# Patient Record
Sex: Female | Born: 1965 | Race: Black or African American | Hispanic: No | Smoking: Never smoker
Health system: Southern US, Community
[De-identification: ages and names within clinical notes are randomized; demographics above are authoritative.]

## PROBLEM LIST (undated history)

## (undated) DIAGNOSIS — J45909 Unspecified asthma, uncomplicated: Secondary | ICD-10-CM

## (undated) DIAGNOSIS — M549 Dorsalgia, unspecified: Secondary | ICD-10-CM

## (undated) DIAGNOSIS — Z765 Malingerer [conscious simulation]: Secondary | ICD-10-CM

## (undated) DIAGNOSIS — I10 Essential (primary) hypertension: Secondary | ICD-10-CM

## (undated) DIAGNOSIS — I639 Cerebral infarction, unspecified: Secondary | ICD-10-CM

## (undated) DIAGNOSIS — G8929 Other chronic pain: Secondary | ICD-10-CM

## (undated) DIAGNOSIS — F1190 Opioid use, unspecified, uncomplicated: Secondary | ICD-10-CM

## (undated) HISTORY — PX: KNEE SURGERY: SHX244

## (undated) HISTORY — PX: CHOLECYSTECTOMY: SHX55

## (undated) HISTORY — PX: ABDOMINAL HYSTERECTOMY: SHX81

---

## 2005-09-22 ENCOUNTER — Emergency Department (HOSPITAL_COMMUNITY): Admission: EM | Admit: 2005-09-22 | Discharge: 2005-09-22 | Payer: Self-pay | Admitting: Emergency Medicine

## 2017-10-01 ENCOUNTER — Encounter (HOSPITAL_BASED_OUTPATIENT_CLINIC_OR_DEPARTMENT_OTHER): Payer: Self-pay

## 2017-10-01 ENCOUNTER — Other Ambulatory Visit: Payer: Self-pay

## 2017-10-01 ENCOUNTER — Emergency Department (HOSPITAL_BASED_OUTPATIENT_CLINIC_OR_DEPARTMENT_OTHER)
Admission: EM | Admit: 2017-10-01 | Discharge: 2017-10-02 | Disposition: A | Discharge status: Home / Self Care | Payer: Self-pay | Attending: Emergency Medicine | Admitting: Emergency Medicine

## 2017-10-01 DIAGNOSIS — G894 Chronic pain syndrome: Secondary | ICD-10-CM | POA: Insufficient documentation

## 2017-10-01 DIAGNOSIS — Z91010 Allergy to peanuts: Secondary | ICD-10-CM | POA: Insufficient documentation

## 2017-10-01 DIAGNOSIS — Z9049 Acquired absence of other specified parts of digestive tract: Secondary | ICD-10-CM | POA: Insufficient documentation

## 2017-10-01 DIAGNOSIS — Z765 Malingerer [conscious simulation]: Secondary | ICD-10-CM | POA: Insufficient documentation

## 2017-10-01 DIAGNOSIS — J45909 Unspecified asthma, uncomplicated: Secondary | ICD-10-CM | POA: Insufficient documentation

## 2017-10-01 DIAGNOSIS — I10 Essential (primary) hypertension: Secondary | ICD-10-CM | POA: Insufficient documentation

## 2017-10-01 HISTORY — DX: Malingerer (conscious simulation): Z76.5

## 2017-10-01 HISTORY — DX: Dorsalgia, unspecified: M54.9

## 2017-10-01 HISTORY — DX: Opioid use, unspecified, uncomplicated: F11.90

## 2017-10-01 HISTORY — DX: Other chronic pain: G89.29

## 2017-10-01 HISTORY — DX: Essential (primary) hypertension: I10

## 2017-10-01 HISTORY — DX: Unspecified asthma, uncomplicated: J45.909

## 2017-10-01 MED ORDER — KETOROLAC TROMETHAMINE 30 MG/ML IJ SOLN
INTRAMUSCULAR | Status: AC
  Administered 2017-10-01: 30 mg
  Filled 2017-10-01: qty 1

## 2017-10-01 MED ORDER — DEXAMETHASONE SODIUM PHOSPHATE 4 MG/ML IJ SOLN
INTRAMUSCULAR | Status: AC
  Filled 2017-10-01: qty 1

## 2017-10-01 MED ORDER — ONDANSETRON 4 MG PO TBDP
ORAL_TABLET | ORAL | Status: AC
  Administered 2017-10-01: 4 mg
  Filled 2017-10-01: qty 1

## 2017-10-01 MED ORDER — DEXAMETHASONE SODIUM PHOSPHATE 10 MG/ML IJ SOLN
INTRAMUSCULAR | Status: AC
  Administered 2017-10-01: 10 mg
  Filled 2017-10-01: qty 1

## 2017-10-01 NOTE — ED Triage Notes (Addendum)
Pt c/o chronic back pain-states she just flew from [place] today and left meds at home-NAD-steady gait-pt entered triage talking on phone

## 2017-10-01 NOTE — ED Notes (Signed)
ED Provider at bedside. 

## 2017-10-02 ENCOUNTER — Encounter (HOSPITAL_BASED_OUTPATIENT_CLINIC_OR_DEPARTMENT_OTHER): Payer: Self-pay | Admitting: Emergency Medicine

## 2017-10-02 LAB — URINALYSIS, ROUTINE W REFLEX MICROSCOPIC
BILIRUBIN URINE: NEGATIVE
Glucose, UA: NEGATIVE mg/dL
Ketones, ur: NEGATIVE mg/dL
Leukocytes, UA: NEGATIVE
Nitrite: NEGATIVE
PH: 7 (ref 5.0–8.0)
Protein, ur: 100 mg/dL — AB
SPECIFIC GRAVITY, URINE: 1.02 (ref 1.005–1.030)

## 2017-10-02 LAB — URINALYSIS, MICROSCOPIC (REFLEX): WBC UA: NONE SEEN WBC/hpf (ref 0–5)

## 2017-10-02 MED ORDER — LIDOCAINE 5 % EX PTCH: 1.0000 | MEDICATED_PATCH | CUTANEOUS | 0 refills | Status: DC

## 2017-10-02 MED ORDER — CLONIDINE HCL 0.1 MG PO TABS
0.1000 mg | ORAL_TABLET | Freq: Once | ORAL | Status: AC
  Administered 2017-10-02: 0.1 mg via ORAL
  Filled 2017-10-02: qty 1

## 2017-10-02 NOTE — ED Provider Notes (Signed)
[facility] EMERGENCY DEPARTMENT Provider Note   CSN: [id number] Arrival date & time: 10/01/17  2240     History   Chief Complaint Chief Complaint  Patient presents with  . Back Pain    HPI Gina DykeKathy Cummings is a 52 y.o. female.  The history is provided by the patient.  Back Pain   This is a chronic problem. The current episode started more than 1 week ago (years). The problem occurs constantly. The problem has not changed since onset.The pain is associated with no known injury. The pain is present in the lumbar spine and sacro-iliac joint. The quality of the pain is described as stabbing. The pain does not radiate. The pain is severe. The pain is the same all the time. Pertinent negatives include no chest pain, no fever, no numbness, no weight loss, no headaches, no abdominal pain, no abdominal swelling, no bowel incontinence, no perianal numbness, no bladder incontinence, no dysuria, no pelvic pain, no leg pain, no paresthesias, no paresis, no tingling and no weakness. The treatment provided no relief. Risk factors include obesity.  Patient with known HTN, chronic pain on multiple opioids at home reports she is here until 10/17/17 to help her pregnant daughter who she reports is having seizures.  [place] and reports [place] [facility] [place] her safe and cannot get them until Tuesday.    Past Medical History:  Diagnosis Date  . Asthma   . Chronic back pain   . Chronic, continuous use of opioids   . Hypertension     There are no active problems to display for this patient.   Past Surgical History:  Procedure Laterality Date  . ABDOMINAL HYSTERECTOMY    . CHOLECYSTECTOMY    . KNEE SURGERY       OB History   None      Home Medications    Prior to Admission medications   Medication Sig Start Date End Date Taking? Authorizing Provider  lidocaine (LIDODERM) 5 % Place 1 patch onto the skin daily. Remove &  Discard patch within 12 hours or as directed by MD 10/02/17   Nicanor AlconPalumbo, Kenzlie Disch, MD    Family History No family history on file.  Social History Social History   Tobacco Use  . Smoking status: Never Smoker  . Smokeless tobacco: Never Used  Substance Use Topics  . Alcohol use: Never    Frequency: Never  . Drug use: Never     Allergies   Lisinopril; Morphine and related; Peanut-containing drug products; Penicillins; and Shellfish allergy   Review of Systems Review of Systems  Constitutional: Negative for fever and weight loss.  Cardiovascular: Negative for chest pain.  Gastrointestinal: Negative for abdominal pain and bowel incontinence.  Genitourinary: Negative for bladder incontinence, dysuria and pelvic pain.  Musculoskeletal: Positive for back pain.  Neurological: Negative for tingling, weakness, numbness, headaches and paresthesias.  All other systems reviewed and are negative.    Physical Exam Updated Vital Signs BP (!) 190/96 (BP Location: Right Arm)   Pulse 84   Temp 98.4 F (36.9 C) (Oral)   Resp 20   Ht 4\' 11"  (1.499 m)   Wt 99.3 kg (219 lb)   SpO2 100%   BMI 44.23 kg/m   Physical Exam  Constitutional: She is oriented to person, place, and time. She appears well-developed and well-nourished. No distress.  HENT:  Head: Normocephalic and atraumatic.  Mouth/Throat: No oropharyngeal  exudate.  Eyes: Pupils are equal, round, and reactive to light. Conjunctivae are normal.  Neck: Normal range of motion. Neck supple.  Cardiovascular: Normal rate, regular rhythm, normal heart sounds and intact distal pulses.  Pulmonary/Chest: Effort normal and breath sounds normal. No stridor. She has no wheezes. She has no rales.  Abdominal: Soft. Bowel sounds are normal. She exhibits no mass. There is no tenderness. There is no rebound and no guarding.  Musculoskeletal: Normal range of motion.  Neurological: She is alert and oriented to person, place, and time.  Skin: Skin is  warm and dry. Capillary refill takes less than 2 seconds.  Psychiatric: She has a normal mood and affect.  Nursing note and vitals reviewed.    ED Treatments / Results  Labs (all labs ordered are listed, but only abnormal results are displayed)  Results for orders placed or performed during the hospital encounter of 10/01/17  Urinalysis, Routine w reflex microscopic  Result Value Ref Range   Color, Urine YELLOW YELLOW   APPearance HAZY (A) CLEAR   Specific Gravity, Urine 1.020 1.005 - 1.030   pH 7.0 5.0 - 8.0   Glucose, UA NEGATIVE NEGATIVE mg/dL   Hgb urine dipstick TRACE (A) NEGATIVE   Bilirubin Urine NEGATIVE NEGATIVE   Ketones, ur NEGATIVE NEGATIVE mg/dL   Protein, ur 960 (A) NEGATIVE mg/dL   Nitrite NEGATIVE NEGATIVE   Leukocytes, UA NEGATIVE NEGATIVE  Urinalysis, Microscopic (reflex)  Result Value Ref Range   RBC / HPF 0-5 0 - 5 RBC/hpf   WBC, UA NONE SEEN 0 - 5 WBC/hpf   Bacteria, UA RARE (A) NONE SEEN   Squamous Epithelial / LPF 0-5 (A) NONE SEEN    Procedures Procedures (including critical care time)  Medications Ordered in ED Medications  dexamethasone (DECADRON) 4 MG/ML injection (has no administration in time range)  ondansetron (ZOFRAN-ODT) 4 MG disintegrating tablet (4 mg  Given 10/01/17 2347)  ketorolac (TORADOL) 30 MG/ML injection (30 mg  Given 10/01/17 2348)  dexamethasone (DECADRON) 10 MG/ML injection (10 mg  Given 10/01/17 2351)  cloNIDine (CATAPRES) tablet 0.1 mg (0.1 mg Oral Given 10/02/17 0030)    EDP was polite but honest that we do not refill narcotics in the ED and apologized for the inconvenience.  I stated we would treat her with non narcotic medications and she was amenable to this initially.    According to records in care everywhere she is on oxycodone 15 mg and norco and an additional narcotic which appears to be morphine from records (she states she is allergic to morphine here).  Per notes she received 104# oxy IR on 3/21. The last note from  her doctor stated she was obtaining narcotics from multiple sources.   She was treated for pain with non narcotic meds in the ED and for potential withdrawal clonidine.  She stated she is here until 4/13 and can't get her meds til Tuesday.   I was approached by nurse stating the patient is now getting her meds from her daughter and they will be here tomorrow.  The story is changing and I suspect drug seeking behavior and I will not be giving narcotics as the patient will continue to return.    Patient refusing to leave the ED at discharge.    Final Clinical Impressions(s) / ED Diagnoses   Final diagnoses:  Chronic pain disorder   Return for weakness, numbness, changes in vision or speech, fevers >100.4 unrelieved by medication, shortness of breath, intractable vomiting, or diarrhea,  abdominal pain, Inability to tolerate liquids or food, cough, altered mental status or any concerns. No signs of systemic illness or infection. The patient is nontoxic-appearing on exam and vital signs are within normal limits.   I have reviewed the triage vital signs and the nursing notes. Pertinent labs &imaging results that were available during my care of the patient were reviewed by me and considered in my medical decision making (see chart for details).  After history, exam, and medical workup I feel the patient has been appropriately medically screened and is safe for discharge home. Pertinent diagnoses were discussed with the patient. Patient was given return precautions.   Patient tore up RX and left  ED Discharge Orders        Ordered    lidocaine (LIDODERM) 5 %  Every 24 hours     10/02/17 0034       Kipper Buch, MD 10/02/17 1610

## 2017-10-02 NOTE — ED Notes (Signed)
Patient is upset stating that she don't want the Lidocaine patch.  She stated that she forgot her pain med in [place] where she came from.  She wanted to talk to MD.  MD made aware as well as the the charge nurse.

## 2017-10-02 NOTE — ED Notes (Signed)
Asking nurse to ask MD for pain medicine and that her pain medicine is going to be over nighted  and will arrive tomorrow. ED MD informed of same

## 2017-10-02 NOTE — ED Notes (Signed)
Pt upset about not receiving a narcotic prescription. Pt ripped off rx for lidocaine and gave it back stating it doesn't work. Pt became very argumentative upon discharge. Pt wanting us to pay for her [employer] to hotel. [facility] [employer] for her to call. Security escorted pt to discharge.

## 2019-02-08 ENCOUNTER — Encounter (HOSPITAL_COMMUNITY): Payer: Self-pay | Admitting: Family Medicine

## 2019-02-08 ENCOUNTER — Emergency Department (HOSPITAL_COMMUNITY): Payer: PRIVATE HEALTH INSURANCE

## 2019-02-08 ENCOUNTER — Emergency Department (HOSPITAL_COMMUNITY)
Admission: EM | Admit: 2019-02-08 | Discharge: 2019-02-09 | Disposition: A | Discharge status: Home / Self Care | Payer: PRIVATE HEALTH INSURANCE | Attending: Emergency Medicine | Admitting: Emergency Medicine

## 2019-02-08 ENCOUNTER — Other Ambulatory Visit: Payer: Self-pay

## 2019-02-08 DIAGNOSIS — R0789 Other chest pain: Secondary | ICD-10-CM | POA: Diagnosis not present

## 2019-02-08 DIAGNOSIS — J45909 Unspecified asthma, uncomplicated: Secondary | ICD-10-CM | POA: Insufficient documentation

## 2019-02-08 DIAGNOSIS — R197 Diarrhea, unspecified: Secondary | ICD-10-CM

## 2019-02-08 DIAGNOSIS — I10 Essential (primary) hypertension: Secondary | ICD-10-CM

## 2019-02-08 DIAGNOSIS — R51 Headache: Secondary | ICD-10-CM | POA: Diagnosis not present

## 2019-02-08 DIAGNOSIS — R112 Nausea with vomiting, unspecified: Secondary | ICD-10-CM

## 2019-02-08 HISTORY — DX: Cerebral infarction, unspecified: I63.9

## 2019-02-08 LAB — BASIC METABOLIC PANEL
Anion gap: 13 (ref 5–15)
BUN: 6 mg/dL (ref 6–20)
CO2: 23 mmol/L (ref 22–32)
Calcium: 9.2 mg/dL (ref 8.9–10.3)
Chloride: 103 mmol/L (ref 98–111)
Creatinine, Ser: 0.71 mg/dL (ref 0.44–1.00)
GFR calc Af Amer: >60 mL/min (ref >60)
GFR calc non Af Amer: >60 mL/min (ref >60)
Glucose, Bld: 89 mg/dL (ref 70–99)
Potassium: 3.4 mmol/L — ABNORMAL LOW (ref 3.5–5.1)
Sodium: 139 mmol/L (ref 135–145)

## 2019-02-08 LAB — TROPONIN I (HIGH SENSITIVITY)
Troponin I (High Sensitivity): 4 ng/L (ref <18)
Troponin I (High Sensitivity): 5 ng/L (ref <18)

## 2019-02-08 LAB — CBC
HCT: 41.5 % (ref 36.0–46.0)
Hemoglobin: 13.8 g/dL (ref 12.0–15.0)
MCH: 31.3 pg (ref 26.0–34.0)
MCHC: 33.3 g/dL (ref 30.0–36.0)
MCV: 94.1 fL (ref 80.0–100.0)
Platelets: 280 10*3/uL (ref 150–400)
RBC: 4.41 MIL/uL (ref 3.87–5.11)
RDW: 12.9 % (ref 11.5–15.5)
WBC: 3.6 10*3/uL — ABNORMAL LOW (ref 4.0–10.5)
nRBC: 0 % (ref 0.0–0.2)

## 2019-02-08 LAB — I-STAT BETA HCG BLOOD, ED (MC, WL, AP ONLY): I-stat hCG, quantitative: <5 m[IU]/mL (ref <5)

## 2019-02-08 LAB — D-DIMER, QUANTITATIVE (NOT AT ARMC): D-Dimer, Quant: 0.28 ug/mL-FEU (ref 0.00–0.50)

## 2019-02-08 MED ORDER — SODIUM CHLORIDE 0.9% FLUSH: 3.0000 mL | Freq: Once | INTRAVENOUS | Status: DC

## 2019-02-08 MED ORDER — ONDANSETRON HCL 4 MG/2ML IJ SOLN
4.0000 mg | Freq: Once | INTRAMUSCULAR | Status: AC
  Administered 2019-02-08: 4 mg via INTRAVENOUS
  Filled 2019-02-08: qty 2

## 2019-02-08 MED ORDER — CLONIDINE HCL 0.1 MG PO TABS
0.1000 mg | ORAL_TABLET | Freq: Once | ORAL | Status: AC
  Administered 2019-02-09: 0.1 mg via ORAL
  Filled 2019-02-08: qty 1

## 2019-02-08 MED ORDER — FENTANYL CITRATE (PF) 100 MCG/2ML IJ SOLN
50.0000 ug | Freq: Once | INTRAMUSCULAR | Status: DC
  Filled 2019-02-08: qty 2

## 2019-02-08 MED ORDER — HYDROMORPHONE HCL 1 MG/ML IJ SOLN
0.5000 mg | Freq: Once | INTRAMUSCULAR | Status: AC
  Administered 2019-02-08: 23:00:00 0.5 mg via INTRAVENOUS
  Filled 2019-02-08: qty 1

## 2019-02-08 NOTE — ED Notes (Signed)
Pt called out with IV team at bedside for assistance. Writer went to check on pt and found pt upset that IV team had placed her IV and pt did not have her pain medication immediatly following. Primary RN aware.

## 2019-02-08 NOTE — ED Triage Notes (Signed)
Patient states she has took her BP medication in 2 days due to her luggage not being here from her flight.

## 2019-02-08 NOTE — ED Notes (Signed)
IV team at bedside 

## 2019-02-08 NOTE — ED Notes (Signed)
Pt given new emesis bag after having x2 emesis episodes in blankets on bed. Pt cleaned and Primary RN at bedside administering medication.

## 2019-02-08 NOTE — ED Triage Notes (Signed)
Patient is complaining of left sided chest pain that started Sunday. Patient states the pain is sharp and radiates to her back. Patient reports she has flew from [facility] today. Associated symptoms of shortness of breath, lightheadedness, dizziness, diaphoresis, nausea, and vomiting. Patient ambulates with a steady gait, skin is warm and dry, and respirations are even and regular.

## 2019-02-08 NOTE — ED Notes (Signed)
ED PA at bedside

## 2019-02-08 NOTE — ED Provider Notes (Signed)
[facility]-EMERGENCY DEPT Provider Note   CSN: [id number] Arrival date & time: 02/08/19  1704    History   Chief Complaint Chief Complaint  Patient presents with  . Chest Pain    HPI Gina Cummings is a 53 y.o. female.     The history is provided by the patient and medical records. No language interpreter was used.  Chest Pain Associated symptoms: abdominal pain, nausea and vomiting   Associated symptoms: no cough, no palpitations and no shortness of breath     Gina Cummings is a 53 y.o. female  with a PMH of hypertension, prior stroke, drug-seeking behavior and chronic pain who presents to the Emergency Department complaining of chest pain which started yesterday.  Also reports nausea with 4-5 episodes of emesis yesterday.  Has had 2 episodes of diarrhea as well.  She reports that she flew here from [place] 3 days ago.  Unfortunately, her medications are in her luggage which have not made it to [name] yet.  She states that she is on HCTZ and clonidine regularly, but does not know what dose or how often.  Chart review does show multiple ER visits with elevated blood pressure in similar ranges today.  She denies taking any opioids regularly, but I was notified by pharmacy tech that according to the national database she was prescribed #70 oxycodone HCL 15 mg on July 31.  She reports history of similar constellation of symptoms several times in the past when her blood pressure gets high.  She states that she normally gets pain medication and then her blood pressure comes down and she feels better.  Past Medical History:  Diagnosis Date  . Asthma   . Chronic back pain   . Chronic, continuous use of opioids   . Drug-seeking behavior   . Hypertension   . Stroke [facility](H[facility])     There are no active problems to display for this patient.   Past Surgical History:  Procedure Laterality Date  . ABDOMINAL HYSTERECTOMY    . CHOLECYSTECTOMY    . KNEE SURGERY       OB  History   No obstetric history on file.      Home Medications    Prior to Admission medications   Medication Sig Start Date End Date Taking? Authorizing Provider  lidocaine (LIDODERM) 5 % Place 1 patch onto the skin daily. Remove & Discard patch within 12 hours or as directed by MD Patient not taking: Reported on 02/08/2019 10/02/17   Nicanor AlconPalumbo, April, MD    Family History History reviewed. No pertinent family history.  Social History Social History   Tobacco Use  . Smoking status: Never Smoker  . Smokeless tobacco: Never Used  Substance Use Topics  . Alcohol use: Never    Frequency: Never  . Drug use: Never     Allergies   Peanut-containing drug products, Penicillins, Aspirin, Gabapentin, Ibuprofen, Lisinopril, Morphine and related, Nsaids, Shellfish allergy, Tramadol, Trazodone, Fentanyl, and Morphine   Review of Systems Review of Systems  Respiratory: Negative for cough and shortness of breath.   Cardiovascular: Positive for chest pain. Negative for palpitations and leg swelling.  Gastrointestinal: Positive for abdominal pain, diarrhea, nausea and vomiting.  All other systems reviewed and are negative.    Physical Exam Updated Vital Signs BP (!) 209/107   Pulse (!) 103   Temp 98.2 F (36.8 C) (Oral)   Resp (!) 21   Ht 4\' 11"  (1.499 m)   Wt 93.9 kg  SpO2 100%   BMI 41.81 kg/m   Physical Exam Vitals signs and nursing note reviewed.  Constitutional:      General: She is not in acute distress.    Appearance: She is well-developed.  HENT:     Head: Normocephalic and atraumatic.  Neck:     Musculoskeletal: Neck supple.  Cardiovascular:     Rate and Rhythm: Normal rate and regular rhythm.     Heart sounds: Normal heart sounds. No murmur.  Pulmonary:     Effort: Pulmonary effort is normal. No respiratory distress.     Breath sounds: Normal breath sounds.     Comments: + Chest wall tenderness Abdominal:     General: There is no distension.      Palpations: Abdomen is soft.     Comments: Generalized abdominal tenderness without focality.  No rebound or guarding.  Skin:    General: Skin is warm and dry.  Neurological:     Mental Status: She is alert and oriented to person, place, and time.      ED Treatments / Results  Labs (all labs ordered are listed, but only abnormal results are displayed) Labs Reviewed  BASIC METABOLIC PANEL - Abnormal; Notable for the following components:      Result Value   Potassium 3.4 (*)    All other components within normal limits  CBC - Abnormal; Notable for the following components:   WBC 3.6 (*)    All other components within normal limits  D-DIMER, QUANTITATIVE (NOT AT [facility])  I-STAT BETA HCG BLOOD, ED ([facility], [facility], [facility] ONLY)  TROPONIN I (HIGH SENSITIVITY)  TROPONIN I (HIGH SENSITIVITY)    EKG EKG Interpretation  Date/Time:  Tuesday February 08 2019 17:30:46 EDT Ventricular Rate:  77 PR Interval:    QRS Duration: 108 QT Interval:  414 QTC Calculation: 469 R Axis:   40 Text Interpretation:  Sinus rhythm Ventricular premature complex Probable left atrial enlargement Left ventricular hypertrophy Baseline wander in lead(s) V6 No old tracing to compare Confirmed by Delon Revelo, Cyril Mourning [phone]) on 02/09/2019 12:31:56 AM   Radiology Dg Chest 2 View  Result Date: 02/08/2019 CLINICAL DATA:  Nausea, vomiting, dizziness and weakness over the last 2 days. EXAM: CHEST - 2 VIEW COMPARISON:  09/22/2005 FINDINGS: The heart size and mediastinal contours are within normal limits. Both lungs are clear. The visualized skeletal structures are unremarkable. IMPRESSION: No active cardiopulmonary disease. Electronically Signed   By: Nelson Chimes M.D.   On: 02/08/2019 19:24   Ct Head Wo Contrast  Result Date: 02/08/2019 CLINICAL DATA:  Headaches for several months EXAM: CT HEAD WITHOUT CONTRAST TECHNIQUE: Contiguous axial images were obtained from the base of the skull through the vertex without intravenous contrast.  COMPARISON:  None. FINDINGS: Brain: No evidence of acute infarction, hemorrhage, hydrocephalus, extra-axial collection or mass lesion/mass effect. Vascular: No hyperdense vessel or unexpected calcification. Skull: Normal. Negative for fracture or focal lesion. Sinuses/Orbits: No acute finding. Other: None. IMPRESSION: Normal head CT Electronically Signed   By: Inez Catalina M.D.   On: 02/08/2019 23:05    Procedures Procedures (including critical care time)  Medications Ordered in ED Medications  sodium chloride flush (NS) 0.9 % injection 3 mL (0 mLs Intravenous Hold 02/08/19 2132)  hydrochlorothiazide (HYDRODIURIL) tablet 25 mg (has no administration in time range)  ondansetron (ZOFRAN) injection 4 mg (4 mg Intravenous Given 02/08/19 2321)  HYDROmorphone (DILAUDID) injection 0.5 mg (0.5 mg Intravenous Given 02/08/19 2321)  cloNIDine (CATAPRES) tablet 0.1 mg (0.1 mg  Oral Given 02/09/19 0100)  potassium chloride SA (K-DUR) CR tablet 40 mEq (40 mEq Oral Given 02/09/19 0059)  promethazine (PHENERGAN) injection 25 mg (25 mg Intravenous Given 02/09/19 0055)  sodium chloride 0.9 % bolus 1,000 mL (1,000 mLs Intravenous New Bag/Given 02/09/19 0053)     Initial Impression / Assessment and Plan / ED Course  I have reviewed the triage vital signs and the nursing notes.  Pertinent labs & imaging results that were available during my care of the patient were reviewed by me and considered in my medical decision making (see chart for details).       Gina Cummings is a 53 y.o. female who presents to ED for 2 days of chest pain as well as nausea, vomiting and diarrhea.  On exam, patient is quite hypertensive.  She is here from [place].  I can see records in care everywhere [facility] which show blood pressures in similar ranges.  For example, on 5/7 she was discharged with blood pressure of 217/117.  On June 12, 194/105.  On 7/21, 206/100.  It appears she is noncompliant with medications and has history of such.  Her  work-up today is very reassuring including negative dimer and troponin.  Chest x-ray and CT head without acute findings.  Overall, I am worried that her constellation of symptoms / history are more consistent with opioid withdrawal.  We will give her home blood pressure medications, fluid bolus and Phenergan and reassess.  Anticipate discharge to home if she is able to tolerate p.o. Care assumed by oncoming provider PA Petrucelli. Case discussed, plan agreed upon.   Final Clinical Impressions(s) / ED Diagnoses   Final diagnoses:  None    ED Discharge Orders    None       Johngabriel Verde, Chase PicketJaime Pilcher, PA-C 02/09/19 0117    Charlynne PanderYao, David Hsienta, MD 02/11/19 1452

## 2019-02-09 DIAGNOSIS — R0789 Other chest pain: Secondary | ICD-10-CM | POA: Diagnosis not present

## 2019-02-09 MED ORDER — OXYCODONE-ACETAMINOPHEN 5-325 MG PO TABS
1.0000 | ORAL_TABLET | Freq: Once | ORAL | Status: AC
  Administered 2019-02-09: 1 via ORAL
  Filled 2019-02-09: qty 1

## 2019-02-09 MED ORDER — PROMETHAZINE HCL 25 MG/ML IJ SOLN
25.0000 mg | Freq: Once | INTRAMUSCULAR | Status: AC
  Administered 2019-02-09: 01:00:00 25 mg via INTRAVENOUS
  Filled 2019-02-09: qty 1

## 2019-02-09 MED ORDER — POTASSIUM CHLORIDE CRYS ER 20 MEQ PO TBCR
40.0000 meq | EXTENDED_RELEASE_TABLET | Freq: Once | ORAL | Status: AC
  Administered 2019-02-09: 01:00:00 40 meq via ORAL
  Filled 2019-02-09: qty 2

## 2019-02-09 MED ORDER — OXYCODONE-ACETAMINOPHEN 5-325 MG PO TABS
ORAL_TABLET | ORAL | Status: AC
  Filled 2019-02-09: qty 1

## 2019-02-09 MED ORDER — PROMETHAZINE HCL 12.5 MG PO TABS: 12.5000 mg | ORAL_TABLET | Freq: Four times a day (QID) | ORAL | 0 refills | Status: DC | PRN

## 2019-02-09 MED ORDER — HYDROCHLOROTHIAZIDE 25 MG PO TABS
25.0000 mg | ORAL_TABLET | Freq: Once | ORAL | Status: AC
  Administered 2019-02-09: 25 mg via ORAL
  Filled 2019-02-09: qty 1

## 2019-02-09 MED ORDER — SODIUM CHLORIDE 0.9 % IV BOLUS
1000.0000 mL | Freq: Once | INTRAVENOUS | Status: AC
  Administered 2019-02-09: 1000 mL via INTRAVENOUS

## 2019-02-09 NOTE — ED Provider Notes (Signed)
01:00: Assumed care of patient from [facility] PA-C at change of shift pending medications & PO challenge.   Please see prior provider note for full H&P.  Labs, imaging, & EKG reviewed and reassuring.  Patient able to tolerate PO w/o difficulty, symptomatically improved, BP improved (hx of similar elevated blood pressure readings w/ prior ED visits)- will discharge home with PRN phenergan per discussion w/ prior provider. I discussed results, treatment plan, need for follow-up, and return precautions with the patient. Provided opportunity for questions, patient confirmed understanding and is in agreement with plan.     Amaryllis Dyke, PA-C 02/09/19 [place], Delice Bison, DO 02/09/19 [phone]

## 2019-02-09 NOTE — Discharge Instructions (Addendum)
You are seen in the emergency department today for nausea, vomiting, diarrhea, and pain.  Your blood pressure was noted to be elevated, please be sure to take your blood pressure medications as prescribed.  You were given doses of these in the emergency department.  We are sending him with Phenergan to take every 6 hours as needed for nausea and vomiting. We have prescribed you new medication(s) today. Discuss the medications prescribed today with your pharmacist as they can have adverse effects and interactions with your other medicines including over the counter and prescribed medications. Seek medical evaluation if you start to experience new or abnormal symptoms after taking one of these medicines, seek care immediately if you start to experience difficulty breathing, feeling of your throat closing, facial swelling, or rash as these could be indications of a more serious allergic reaction  Please follow attached diet guidelines to help with diarrhea.  Please take your regular medicines as prescribed once you have these.  Please follow-up with primary care, if you do not have a primary care please call with a number circled in your discharge instructions.  Return to the ER for new or worsening symptoms or any other concerns.

## 2019-02-09 NOTE — ED Notes (Signed)
Dropped first Percocet on the floor. Dale Harwood witnessed the wast in the sharps container

## 2019-02-10 ENCOUNTER — Emergency Department (HOSPITAL_COMMUNITY)
Admission: EM | Admit: 2019-02-10 | Discharge: 2019-02-10 | Disposition: A | Discharge status: Home / Self Care | Payer: PRIVATE HEALTH INSURANCE | Attending: Emergency Medicine | Admitting: Emergency Medicine

## 2019-02-10 ENCOUNTER — Other Ambulatory Visit: Payer: Self-pay

## 2019-02-10 ENCOUNTER — Encounter (HOSPITAL_COMMUNITY): Payer: Self-pay | Admitting: Emergency Medicine

## 2019-02-10 DIAGNOSIS — Z8709 Personal history of other diseases of the respiratory system: Secondary | ICD-10-CM | POA: Diagnosis not present

## 2019-02-10 DIAGNOSIS — F1123 Opioid dependence with withdrawal: Secondary | ICD-10-CM | POA: Diagnosis not present

## 2019-02-10 DIAGNOSIS — M5432 Sciatica, left side: Secondary | ICD-10-CM

## 2019-02-10 DIAGNOSIS — I10 Essential (primary) hypertension: Secondary | ICD-10-CM | POA: Diagnosis not present

## 2019-02-10 DIAGNOSIS — R51 Headache: Secondary | ICD-10-CM | POA: Diagnosis not present

## 2019-02-10 DIAGNOSIS — Z91010 Allergy to peanuts: Secondary | ICD-10-CM | POA: Diagnosis not present

## 2019-02-10 DIAGNOSIS — R42 Dizziness and giddiness: Secondary | ICD-10-CM | POA: Diagnosis not present

## 2019-02-10 DIAGNOSIS — M5442 Lumbago with sciatica, left side: Secondary | ICD-10-CM | POA: Insufficient documentation

## 2019-02-10 DIAGNOSIS — R112 Nausea with vomiting, unspecified: Secondary | ICD-10-CM

## 2019-02-10 DIAGNOSIS — F1193 Opioid use, unspecified with withdrawal: Secondary | ICD-10-CM

## 2019-02-10 DIAGNOSIS — R197 Diarrhea, unspecified: Secondary | ICD-10-CM | POA: Insufficient documentation

## 2019-02-10 DIAGNOSIS — M545 Low back pain: Secondary | ICD-10-CM | POA: Diagnosis present

## 2019-02-10 LAB — URINALYSIS, ROUTINE W REFLEX MICROSCOPIC
Bacteria, UA: NONE SEEN
Bilirubin Urine: NEGATIVE
Glucose, UA: NEGATIVE mg/dL
Hgb urine dipstick: NEGATIVE
Ketones, ur: NEGATIVE mg/dL
Leukocytes,Ua: NEGATIVE
Nitrite: NEGATIVE
Protein, ur: 30 mg/dL — AB
Specific Gravity, Urine: 1.023 (ref 1.005–1.030)
pH: 5 (ref 5.0–8.0)

## 2019-02-10 LAB — CBC
HCT: 43.4 % (ref 36.0–46.0)
Hemoglobin: 14.4 g/dL (ref 12.0–15.0)
MCH: 31.1 pg (ref 26.0–34.0)
MCHC: 33.2 g/dL (ref 30.0–36.0)
MCV: 93.7 fL (ref 80.0–100.0)
Platelets: 314 10*3/uL (ref 150–400)
RBC: 4.63 MIL/uL (ref 3.87–5.11)
RDW: 12.6 % (ref 11.5–15.5)
WBC: 3.7 10*3/uL — ABNORMAL LOW (ref 4.0–10.5)
nRBC: 0 % (ref 0.0–0.2)

## 2019-02-10 LAB — BASIC METABOLIC PANEL
Anion gap: 13 (ref 5–15)
BUN: 8 mg/dL (ref 6–20)
CO2: 24 mmol/L (ref 22–32)
Calcium: 9.2 mg/dL (ref 8.9–10.3)
Chloride: 100 mmol/L (ref 98–111)
Creatinine, Ser: 0.96 mg/dL (ref 0.44–1.00)
GFR calc Af Amer: >60 mL/min (ref >60)
GFR calc non Af Amer: >60 mL/min (ref >60)
Glucose, Bld: 83 mg/dL (ref 70–99)
Potassium: 4 mmol/L (ref 3.5–5.1)
Sodium: 137 mmol/L (ref 135–145)

## 2019-02-10 LAB — CBG MONITORING, ED: Glucose-Capillary: 78 mg/dL (ref 70–99)

## 2019-02-10 LAB — HEPATIC FUNCTION PANEL
ALT: 44 U/L (ref 0–44)
AST: 24 U/L (ref 15–41)
Albumin: 4.6 g/dL (ref 3.5–5.0)
Alkaline Phosphatase: 136 U/L — ABNORMAL HIGH (ref 38–126)
Bilirubin, Direct: 0.2 mg/dL (ref 0.0–0.2)
Indirect Bilirubin: 0.6 mg/dL (ref 0.3–0.9)
Total Bilirubin: 0.8 mg/dL (ref 0.3–1.2)
Total Protein: 8.9 g/dL — ABNORMAL HIGH (ref 6.5–8.1)

## 2019-02-10 LAB — I-STAT BETA HCG BLOOD, ED (MC, WL, AP ONLY): I-stat hCG, quantitative: <5 m[IU]/mL

## 2019-02-10 LAB — LIPASE, BLOOD: Lipase: 26 U/L (ref 11–51)

## 2019-02-10 MED ORDER — SODIUM CHLORIDE 0.9 % IV BOLUS
1000.0000 mL | Freq: Once | INTRAVENOUS | Status: AC
  Administered 2019-02-10: 1000 mL via INTRAVENOUS

## 2019-02-10 MED ORDER — ONDANSETRON 4 MG PO TBDP: 4.0000 mg | ORAL_TABLET | Freq: Three times a day (TID) | ORAL | 0 refills | Status: DC | PRN

## 2019-02-10 MED ORDER — OXYCODONE HCL 15 MG PO TABS: 15.0000 mg | ORAL_TABLET | ORAL | 0 refills | Status: DC | PRN

## 2019-02-10 MED ORDER — HYDROMORPHONE HCL 1 MG/ML IJ SOLN
0.5000 mg | Freq: Once | INTRAMUSCULAR | Status: AC
  Administered 2019-02-10: 0.5 mg via INTRAVENOUS
  Filled 2019-02-10: qty 1

## 2019-02-10 MED ORDER — AMLODIPINE BESYLATE 5 MG PO TABS
10.0000 mg | ORAL_TABLET | Freq: Once | ORAL | Status: DC
  Filled 2019-02-10: qty 2

## 2019-02-10 MED ORDER — SODIUM CHLORIDE 0.9% FLUSH: 3.0000 mL | Freq: Once | INTRAVENOUS | Status: DC

## 2019-02-10 MED ORDER — OXYCODONE HCL 5 MG PO TABS
15.0000 mg | ORAL_TABLET | Freq: Once | ORAL | Status: AC
  Administered 2019-02-10: 15 mg via ORAL
  Filled 2019-02-10: qty 3

## 2019-02-10 MED ORDER — ALBUTEROL SULFATE HFA 108 (90 BASE) MCG/ACT IN AERS
2.0000 | INHALATION_SPRAY | Freq: Once | RESPIRATORY_TRACT | Status: AC
  Administered 2019-02-10: 20:00:00 2 via RESPIRATORY_TRACT
  Filled 2019-02-10: qty 6.7

## 2019-02-10 MED ORDER — DIPHENHYDRAMINE HCL 50 MG/ML IJ SOLN
25.0000 mg | Freq: Once | INTRAMUSCULAR | Status: AC
  Administered 2019-02-10: 25 mg via INTRAVENOUS
  Filled 2019-02-10: qty 1

## 2019-02-10 MED ORDER — PROCHLORPERAZINE EDISYLATE 10 MG/2ML IJ SOLN
10.0000 mg | Freq: Once | INTRAMUSCULAR | Status: AC
  Administered 2019-02-10: 10 mg via INTRAVENOUS
  Filled 2019-02-10: qty 2

## 2019-02-10 MED ORDER — NIFEDIPINE ER OSMOTIC RELEASE 30 MG PO TB24
30.0000 mg | ORAL_TABLET | Freq: Once | ORAL | Status: AC
  Administered 2019-02-10: 30 mg via ORAL
  Filled 2019-02-10 (×2): qty 1

## 2019-02-10 MED ORDER — HYDROCHLOROTHIAZIDE 50 MG PO TABS
50.0000 mg | ORAL_TABLET | Freq: Every day | ORAL | Status: DC
  Administered 2019-02-10: 50 mg via ORAL
  Filled 2019-02-10: qty 1

## 2019-02-10 MED ORDER — SERTRALINE HCL 50 MG PO TABS
50.0000 mg | ORAL_TABLET | Freq: Every day | ORAL | Status: DC
  Administered 2019-02-10: 50 mg via ORAL
  Filled 2019-02-10: qty 1

## 2019-02-10 MED ORDER — ONDANSETRON HCL 4 MG/2ML IJ SOLN
4.0000 mg | Freq: Once | INTRAMUSCULAR | Status: AC
  Administered 2019-02-10: 4 mg via INTRAVENOUS
  Filled 2019-02-10: qty 2

## 2019-02-10 NOTE — Discharge Instructions (Signed)
Please follow up with your family doc.  Return for worsening pain fever, or inability to eat or drink.

## 2019-02-10 NOTE — Progress Notes (Signed)
Gina Cummings ED TOC CM -referral medications assistance  Pt has a Pharmacist, community and needs assistance paying for her meds. Unable to assist with meds using [employer] due to she has commercial payor for meds. Her meds are inexpensive meds that run $4 to 10 using goodrx if she is unable to have insurance cover again. Jonnie Finner RN CCM Case Mgmt phone [phone]

## 2019-02-10 NOTE — ED Notes (Signed)
Called pharm to ask for procardia.

## 2019-02-10 NOTE — ED Notes (Signed)
Patient verbalizes understanding of discharge instructions. Opportunity for questioning and answers were provided. Armband removed by staff, pt discharged from ED.  
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
## 2019-02-10 NOTE — ED Triage Notes (Signed)
Pt presents with multiple complaints: headache related to HTN, dizziness, chronic back pain from bone disease with burning in her left leg. She reports she is unable to get her medications because they are in her luggage. She is from [place], [place]. A/o at triage tearful speaking clear sentences.

## 2019-02-14 ENCOUNTER — Encounter (HOSPITAL_COMMUNITY): Payer: Self-pay | Admitting: Emergency Medicine

## 2019-02-14 ENCOUNTER — Emergency Department (HOSPITAL_COMMUNITY)
Admission: EM | Admit: 2019-02-14 | Discharge: 2019-02-14 | Disposition: A | Discharge status: Home / Self Care | Payer: Medicare (Managed Care) | Attending: Emergency Medicine | Admitting: Emergency Medicine

## 2019-02-14 ENCOUNTER — Other Ambulatory Visit: Payer: Self-pay

## 2019-02-14 DIAGNOSIS — M545 Low back pain: Secondary | ICD-10-CM | POA: Diagnosis present

## 2019-02-14 DIAGNOSIS — Z8673 Personal history of transient ischemic attack (TIA), and cerebral infarction without residual deficits: Secondary | ICD-10-CM | POA: Insufficient documentation

## 2019-02-14 DIAGNOSIS — I10 Essential (primary) hypertension: Secondary | ICD-10-CM | POA: Diagnosis not present

## 2019-02-14 DIAGNOSIS — G8929 Other chronic pain: Secondary | ICD-10-CM

## 2019-02-14 DIAGNOSIS — J45909 Unspecified asthma, uncomplicated: Secondary | ICD-10-CM | POA: Insufficient documentation

## 2019-02-14 DIAGNOSIS — M5442 Lumbago with sciatica, left side: Secondary | ICD-10-CM | POA: Insufficient documentation

## 2019-02-14 MED ORDER — CLONIDINE HCL 0.1 MG PO TABS: 0.1000 mg | ORAL_TABLET | Freq: Two times a day (BID) | ORAL | 1 refills | Status: AC

## 2019-02-14 MED ORDER — CLONIDINE HCL 0.1 MG PO TABS
0.1000 mg | ORAL_TABLET | Freq: Once | ORAL | Status: AC
  Administered 2019-02-14: 0.1 mg via ORAL
  Filled 2019-02-14: qty 1

## 2019-02-14 MED ORDER — HYDROCHLOROTHIAZIDE 12.5 MG PO CAPS
50.0000 mg | ORAL_CAPSULE | Freq: Once | ORAL | Status: AC
  Administered 2019-02-14: 50 mg via ORAL
  Filled 2019-02-14: qty 4

## 2019-02-14 MED ORDER — HYDROMORPHONE HCL 1 MG/ML IJ SOLN
1.0000 mg | Freq: Once | INTRAMUSCULAR | Status: AC
  Administered 2019-02-14: 1 mg via INTRAMUSCULAR
  Filled 2019-02-14: qty 1

## 2019-02-14 MED ORDER — HYDROCHLOROTHIAZIDE 25 MG PO TABS: 25.0000 mg | ORAL_TABLET | Freq: Every day | ORAL | 1 refills | Status: DC

## 2019-02-14 MED ORDER — OXYCODONE HCL 5 MG PO TABS
15.0000 mg | ORAL_TABLET | Freq: Once | ORAL | Status: AC
  Administered 2019-02-14: 15 mg via ORAL
  Filled 2019-02-14: qty 3
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
## 2019-02-14 NOTE — ED Triage Notes (Signed)
Pt reports that she has cyst in right leg and hurting as well.

## 2019-02-14 NOTE — Discharge Instructions (Addendum)
Your blood pressure prescriptions should both be $4 at [facility] without insurance.   Steps to find a Primary Care Provider (PCP):  Call [phone] or [phone] to access "[place] a Doctor Service."  2.  You may also go on the [facility] website at [email or web address]  3.  [facility] also frequently accepts new patients.  Gina Cummings [phone]  4.  There are also multiple [facility], [facility] and [facility] practices throughout the [place] that are frequently accepting new patients. You may find a clinic that is close to your home and contact them.  [facility] [email or web address] [phone]  [facility] [email or web address]  [email or web address] [place] [email or web address] [phone]  5.  Local Health Departments also can provide primary care services.  [facility]  [place] [address] [phone]  [facility] [place] [address] [place] [facility] [place] [facility] [place] [phone]
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
## 2019-03-13 ENCOUNTER — Emergency Department (HOSPITAL_COMMUNITY)
Admission: EM | Admit: 2019-03-13 | Discharge: 2019-03-13 | Disposition: A | Discharge status: Home / Self Care | Payer: Medicare (Managed Care) | Attending: Emergency Medicine | Admitting: Emergency Medicine

## 2019-03-13 ENCOUNTER — Encounter (HOSPITAL_COMMUNITY): Payer: Self-pay

## 2019-03-13 DIAGNOSIS — I10 Essential (primary) hypertension: Secondary | ICD-10-CM | POA: Diagnosis not present

## 2019-03-13 DIAGNOSIS — M5432 Sciatica, left side: Secondary | ICD-10-CM | POA: Diagnosis not present

## 2019-03-13 DIAGNOSIS — Z8673 Personal history of transient ischemic attack (TIA), and cerebral infarction without residual deficits: Secondary | ICD-10-CM | POA: Diagnosis not present

## 2019-03-13 DIAGNOSIS — J45909 Unspecified asthma, uncomplicated: Secondary | ICD-10-CM | POA: Diagnosis not present

## 2019-03-13 DIAGNOSIS — Z79899 Other long term (current) drug therapy: Secondary | ICD-10-CM | POA: Diagnosis not present

## 2019-03-13 DIAGNOSIS — M79605 Pain in left leg: Secondary | ICD-10-CM | POA: Diagnosis present

## 2019-03-13 MED ORDER — KETOROLAC TROMETHAMINE 30 MG/ML IJ SOLN
30.0000 mg | Freq: Once | INTRAMUSCULAR | Status: AC
  Administered 2019-03-13: 09:00:00 30 mg via INTRAMUSCULAR
  Filled 2019-03-13: qty 1

## 2019-03-13 MED ORDER — CLONIDINE HCL 0.1 MG PO TABS
0.3000 mg | ORAL_TABLET | Freq: Once | ORAL | Status: AC
  Administered 2019-03-13: 0.3 mg via ORAL
  Filled 2019-03-13: qty 3

## 2019-03-13 MED ORDER — PREDNISONE 20 MG PO TABS
60.0000 mg | ORAL_TABLET | Freq: Once | ORAL | Status: AC
  Administered 2019-03-13: 60 mg via ORAL
  Filled 2019-03-13: qty 3

## 2019-03-13 MED ORDER — HYDROCHLOROTHIAZIDE 12.5 MG PO CAPS
25.0000 mg | ORAL_CAPSULE | Freq: Once | ORAL | Status: AC
  Administered 2019-03-13: 25 mg via ORAL
  Filled 2019-03-13: qty 2

## 2019-03-13 MED ORDER — AMLODIPINE BESYLATE 5 MG PO TABS
10.0000 mg | ORAL_TABLET | Freq: Once | ORAL | Status: AC
  Administered 2019-03-13: 09:00:00 10 mg via ORAL
  Filled 2019-03-13: qty 2

## 2019-03-13 MED ORDER — AMLODIPINE BESYLATE 10 MG PO TABS: 5.0000 mg | ORAL_TABLET | Freq: Every day | ORAL | 3 refills | Status: DC

## 2019-03-13 MED ORDER — PREDNISONE 20 MG PO TABS: 20.0000 mg | ORAL_TABLET | Freq: Two times a day (BID) | ORAL | 0 refills | Status: DC

## 2019-03-13 NOTE — ED Notes (Signed)
ED Provider at bedside. 

## 2019-03-13 NOTE — ED Notes (Signed)
She is unhappy. She tells me "My daughter's at the hospital-they operated on her brain". She declines any d/c v.s., nor any other assessment.

## 2019-03-13 NOTE — ED Triage Notes (Signed)
Pt c/o left side hip and leg pain, radiating to back. Pt states she has been sleeping in a hospital chair.

## 2019-03-13 NOTE — ED Provider Notes (Signed)
[place] DEPT Provider Note   CSN: [id number] Arrival date & time: 03/13/19  0731     History   Chief Complaint Chief Complaint  Patient presents with   Leg Pain    HPI Gina Cummings is a 53 y.o. female.     HPI   She presents for evaluation of "sciatica, left-sided, aggravated by "sitting in hospital chair."  She states that she has had sciatica for many years and has had interventions including "nerve injection," and chronic pain treatments.  She states that since last seen her chronic medication provider, about a week ago in [place], she is relocated to [place].  She states she saw a primary care provider at [facility] clinic, several days ago and was given "a few pills."  She states that when she gets like this she needs to "have a shot of Dilaudid."  She states that her daughter had brain surgery several days ago which is why she is staying in the hospital and sitting in hospital chair.  She states she did not take her blood pressure this morning but did yesterday and feels that she needs to have some blood pressure medication changes.  She denies headache, chest pain, weakness or dizziness.  There are no other known modifying factors.  Past Medical History:  Diagnosis Date   Asthma    Chronic back pain    Chronic, continuous use of opioids    Drug-seeking behavior    Hypertension    Stroke ([place])     There are no active problems to display for this patient.   Past Surgical History:  Procedure Laterality Date   ABDOMINAL HYSTERECTOMY     CHOLECYSTECTOMY     KNEE SURGERY       OB History   No obstetric history on file.      Home Medications    Prior to Admission medications   Medication Sig Start Date End Date Taking? Authorizing Provider  albuterol (VENTOLIN HFA) 108 (90 Base) MCG/ACT inhaler Inhale 1-2 puffs into the lungs every 4 (four) hours as needed for shortness of breath. 02/09/19 02/10/20 Yes  [provider]  ciclesonide (ALVESCO) 160 MCG/ACT inhaler Inhale 1 puff into the lungs 2 (two) times daily. 02/09/19 02/10/20 Yes [provider]  cloNIDine (CATAPRES) 0.1 MG tablet Take 1 tablet (0.1 mg total) by mouth 2 (two) times daily. 02/14/19  Yes Ward, Delice Bison, DO  hydrochlorothiazide (HYDRODIURIL) 25 MG tablet Take 1 tablet (25 mg total) by mouth daily. 02/14/19  Yes Ward, Kristen N, DO  ondansetron (ZOFRAN ODT) 4 MG disintegrating tablet Take 1 tablet (4 mg total) by mouth every 8 (eight) hours as needed for nausea or vomiting. 02/10/19  Yes Deno Etienne, DO  oxyCODONE (ROXICODONE) 15 MG immediate release tablet Take 1 tablet (15 mg total) by mouth every 4 (four) hours as needed for severe pain. 02/10/19  Yes Deno Etienne, DO  promethazine (PHENERGAN) 12.5 MG tablet Take 1 tablet (12.5 mg total) by mouth every 6 (six) hours as needed for nausea or vomiting. 02/09/19  Yes Petrucelli, Samantha R, PA-C  amLODipine (NORVASC) 10 MG tablet Take 0.5 tablets (5 mg total) by mouth daily. 03/13/19   Daleen Bo, MD  lidocaine (LIDODERM) 5 % Place 1 patch onto the skin daily. Remove & Discard patch within 12 hours or as directed by MD Patient not taking: Reported on 02/08/2019 10/02/17   Palumbo, April, MD  predniSONE (DELTASONE) 20 MG tablet Take 1 tablet (  20 mg total) by mouth 2 (two) times daily. 03/13/19   Mancel BaleWentz, Marvelle Caudill, MD    Family History No family history on file.  Social History Social History   Tobacco Use   Smoking status: Never Smoker   Smokeless tobacco: Never Used  Substance Use Topics   Alcohol use: Never    Frequency: Never   Drug use: Never     Allergies   Peanut-containing drug products, Penicillins, Aspirin, Gabapentin, Ibuprofen, Lisinopril, Morphine and related, Nsaids, Shellfish allergy, Tramadol, Trazodone, Fentanyl, and Morphine   Review of Systems Review of Systems  All other systems reviewed and are negative.    Physical Exam Updated Vital  Signs BP (!) 164/87 (BP Location: Left Arm)    Pulse 62    Temp 98.2 F (36.8 C)    Resp 15    Ht 4\' 11"  (1.499 m)    Wt 91.2 kg    SpO2 97%    BMI 40.60 kg/m   Physical Exam Vitals signs and nursing note reviewed.  Constitutional:      General: She is not in acute distress.    Appearance: She is well-developed. She is obese. She is not ill-appearing, toxic-appearing or diaphoretic.  HENT:     Head: Normocephalic and atraumatic.  Eyes:     Conjunctiva/sclera: Conjunctivae normal.     Pupils: Pupils are equal, round, and reactive to light.  Neck:     Musculoskeletal: Normal range of motion and neck supple.     Trachea: Phonation normal.  Cardiovascular:     Rate and Rhythm: Normal rate.  Pulmonary:     Effort: Pulmonary effort is normal.  Chest:     Chest wall: No tenderness.  Musculoskeletal:     Comments: She is able to independently elevate both legs off the stretcher, but has greater movement right than left.  No significant pain with movement of the left leg.  Skin:    General: Skin is warm and dry.  Neurological:     Mental Status: She is alert and oriented to person, place, and time.     Motor: No abnormal muscle tone.     Comments: No dysarthria or aphasia.  Psychiatric:        Mood and Affect: Mood normal.        Behavior: Behavior normal.        Thought Content: Thought content normal.        Judgment: Judgment normal.      ED Treatments / Results  Labs (all labs ordered are listed, but only abnormal results are displayed) Labs Reviewed - No data to display  EKG None  Radiology No results found.  Procedures Procedures (including critical care time)  Medications Ordered in ED Medications  amLODipine (NORVASC) tablet 10 mg (10 mg Oral Given 03/13/19 0842)  hydrochlorothiazide (MICROZIDE) capsule 25 mg (25 mg Oral Given 03/13/19 0841)  cloNIDine (CATAPRES) tablet 0.3 mg (0.3 mg Oral Given 03/13/19 0842)  predniSONE (DELTASONE) tablet 60 mg (60 mg Oral Given  03/13/19 0844)  ketorolac (TORADOL) 30 MG/ML injection 30 mg (30 mg Intramuscular Given 03/13/19 0844)     Initial Impression / Assessment and Plan / ED Course  I have reviewed the triage vital signs and the nursing notes.  Pertinent labs & imaging results that were available during my care of the patient were reviewed by me and considered in my medical decision making (see chart for details).  Clinical Course as of Mar 12 1136  Wynelle LinkSun Mar 13, 2019  0813 Narcotic databank indicates that she received a prescription for oxycodone, 10 mg, #14, on 03/09/2019.  This was prescribed by her new primary care provider.   [EW]    Clinical Course User Index [EW] Mancel Bale, MD       Records reviewed, available through care everywhere, from [facility].  Clear evidence for noncompliance with treatment and utilizing pain medications inappropriately.  Previously treated for blood pressure with amlodipine, Procardia, hydrochlorothiazide.  She was treated with clonidine, and hydrochlorothiazide, as refills based on her history, by ED provider here, about 4 weeks ago.    Patient Vitals for the past 24 hrs:  BP Temp Pulse Resp SpO2 Height Weight  03/13/19 1030 (!) 164/87 -- 62 15 97 % -- --  03/13/19 0900 (!) 216/108 -- 60 18 100 % -- --  03/13/19 0759 (!) 204/108 -- 71 -- -- -- --  03/13/19 0758 (!) 204/108 -- 67 17 99 % -- --  03/13/19 0741 (!) 213/123 98.2 F (36.8 C) 82 16 98 % -- --  03/13/19 0740 -- -- -- -- -- 4\' 11"  (1.499 m) 91.2 kg    11:28 AM Reevaluation with update and discussion. After initial assessment and treatment, an updated evaluation reveals she is alert and states she feels better but still has some burning in her left leg.  She can have medicated to receive narcotics, but I explained that I would not treat sciatica with narcotics, at this time.  She did states she did not have a primary care doctor and did not know where she would go for help.  We discussed  changing her blood pressure medicine and she was agreeable to that.  All questions answered. Mancel Bale   Medical Decision Making: Sciatica, with chronic back pain.  Incidental hypertension, possibly related to pain versus needing additional medication.  Patient previously prescribed 3 blood pressure medicines; Procardia, amlodipine and hydrochlorothiazide to control blood pressure as recently as 1 month ago.  Currently taking low-dose clonidine and hydrochlorothiazide.  Will change blood pressure treatment by adding amlodipine, continuing clonidine and hydrochlorothiazide.  Treat sciatica condition with anti-inflammatory medication and encourage patient to find a primary care provider that she wants to work with and consider getting a pain control doctor, after that.  Narcotic therapy is not indicated at this time.  CRITICAL CARE-no Performed by: Mancel Bale \ Nursing Notes Reviewed/ Care Coordinated Applicable Imaging Reviewed Interpretation of Laboratory Data incorporated into ED treatment  The patient appears reasonably screened and/or stabilized for discharge and I doubt any other medical condition or other [facility] requiring further screening, evaluation, or treatment in the ED at this time prior to discharge.  Plan: Home Medications-continue currently prescribed medications; Home Treatments-heat to affected area of sciatica; return here if the recommended treatment, does not improve the symptoms; Recommended follow up-PCP and pain management as soon as possible.  Resource guide given.   Final Clinical Impressions(s) / ED Diagnoses   Final diagnoses:  Sciatica of left side  Hypertension, unspecified type    ED Discharge Orders         Ordered    predniSONE (DELTASONE) 20 MG tablet  2 times daily     03/13/19 1135    amLODipine (NORVASC) 10 MG tablet  Daily     03/13/19 1135           Mancel Bale, MD 03/13/19 1137

## 2019-03-13 NOTE — ED Notes (Signed)
Pharmacy Tech at bedside conducting medication reconciliation.

## 2019-03-13 NOTE — Discharge Instructions (Addendum)
You are being treated for sciatica with anti-inflammatory medication, prednisone.  A prescription was sent to your pharmacy.  Your blood pressure needs to have additional control by adding amlodipine to the current medications, clonidine and hydrochlorothiazide.  We also sent a prescription for that to your pharmacy.  Additionally, to treat sciatica use heat on the sore area in the form of a heating pad 3-4 times a day.  You can try gently stretching your back and left leg to improve the discomfort.  Use the attached resource guide to help you find a primary care doctor and subsequently a pain management doctor to help you with your chronic pain.

## 2019-03-15 ENCOUNTER — Emergency Department (HOSPITAL_COMMUNITY): Payer: Medicare (Managed Care)

## 2019-03-15 ENCOUNTER — Other Ambulatory Visit: Payer: Self-pay

## 2019-03-15 ENCOUNTER — Emergency Department (HOSPITAL_COMMUNITY)
Admission: EM | Admit: 2019-03-15 | Discharge: 2019-03-15 | Disposition: A | Discharge status: Home / Self Care | Payer: Medicare (Managed Care) | Attending: Emergency Medicine | Admitting: Emergency Medicine

## 2019-03-15 DIAGNOSIS — Z888 Allergy status to other drugs, medicaments and biological substances status: Secondary | ICD-10-CM | POA: Insufficient documentation

## 2019-03-15 DIAGNOSIS — J45909 Unspecified asthma, uncomplicated: Secondary | ICD-10-CM | POA: Insufficient documentation

## 2019-03-15 DIAGNOSIS — I10 Essential (primary) hypertension: Secondary | ICD-10-CM | POA: Insufficient documentation

## 2019-03-15 DIAGNOSIS — M5442 Lumbago with sciatica, left side: Secondary | ICD-10-CM | POA: Insufficient documentation

## 2019-03-15 DIAGNOSIS — F1199 Opioid use, unspecified with unspecified opioid-induced disorder: Secondary | ICD-10-CM

## 2019-03-15 DIAGNOSIS — G8929 Other chronic pain: Secondary | ICD-10-CM | POA: Diagnosis not present

## 2019-03-15 DIAGNOSIS — Z8673 Personal history of transient ischemic attack (TIA), and cerebral infarction without residual deficits: Secondary | ICD-10-CM | POA: Insufficient documentation

## 2019-03-15 DIAGNOSIS — F1190 Opioid use, unspecified, uncomplicated: Secondary | ICD-10-CM | POA: Diagnosis not present

## 2019-03-15 DIAGNOSIS — Z79899 Other long term (current) drug therapy: Secondary | ICD-10-CM | POA: Insufficient documentation

## 2019-03-15 DIAGNOSIS — M5441 Lumbago with sciatica, right side: Secondary | ICD-10-CM | POA: Diagnosis not present

## 2019-03-15 DIAGNOSIS — Z880 Allergy status to penicillin: Secondary | ICD-10-CM | POA: Diagnosis not present

## 2019-03-15 DIAGNOSIS — R0789 Other chest pain: Secondary | ICD-10-CM | POA: Diagnosis not present

## 2019-03-15 DIAGNOSIS — M545 Low back pain: Secondary | ICD-10-CM | POA: Diagnosis present

## 2019-03-15 DIAGNOSIS — Z885 Allergy status to narcotic agent status: Secondary | ICD-10-CM | POA: Insufficient documentation

## 2019-03-15 LAB — BASIC METABOLIC PANEL
Anion gap: 13 (ref 5–15)
BUN: 26 mg/dL — ABNORMAL HIGH (ref 6–20)
CO2: 20 mmol/L — ABNORMAL LOW (ref 22–32)
Calcium: 9.2 mg/dL (ref 8.9–10.3)
Chloride: 104 mmol/L (ref 98–111)
Creatinine, Ser: 0.96 mg/dL (ref 0.44–1.00)
GFR calc Af Amer: >60 mL/min (ref >60)
GFR calc non Af Amer: >60 mL/min (ref >60)
Glucose, Bld: 91 mg/dL (ref 70–99)
Potassium: 4.5 mmol/L (ref 3.5–5.1)
Sodium: 137 mmol/L (ref 135–145)

## 2019-03-15 LAB — CBC
HCT: 42.9 % (ref 36.0–46.0)
Hemoglobin: 14.4 g/dL (ref 12.0–15.0)
MCH: 31.4 pg (ref 26.0–34.0)
MCHC: 33.6 g/dL (ref 30.0–36.0)
MCV: 93.7 fL (ref 80.0–100.0)
Platelets: 364 10*3/uL (ref 150–400)
RBC: 4.58 MIL/uL (ref 3.87–5.11)
RDW: 13.5 % (ref 11.5–15.5)
WBC: 5.5 10*3/uL (ref 4.0–10.5)
nRBC: 0 % (ref 0.0–0.2)

## 2019-03-15 LAB — TROPONIN I (HIGH SENSITIVITY)
Troponin I (High Sensitivity): 5 ng/L (ref <18)
Troponin I (High Sensitivity): 5 ng/L (ref <18)

## 2019-03-15 MED ORDER — OXYCODONE HCL 15 MG PO TABS: 15.0000 mg | ORAL_TABLET | ORAL | 0 refills | Status: DC | PRN

## 2019-03-15 MED ORDER — KETOROLAC TROMETHAMINE 60 MG/2ML IM SOLN
15.0000 mg | Freq: Once | INTRAMUSCULAR | Status: AC
  Administered 2019-03-15: 15 mg via INTRAMUSCULAR
  Filled 2019-03-15: qty 2

## 2019-03-15 MED ORDER — DIAZEPAM 5 MG PO TABS
5.0000 mg | ORAL_TABLET | Freq: Once | ORAL | Status: AC
  Administered 2019-03-15: 5 mg via ORAL
  Filled 2019-03-15: qty 1

## 2019-03-15 MED ORDER — ACETAMINOPHEN 500 MG PO TABS
1000.0000 mg | ORAL_TABLET | Freq: Once | ORAL | Status: AC
  Administered 2019-03-15: 1000 mg via ORAL
  Filled 2019-03-15: qty 2

## 2019-03-15 MED ORDER — HYDROMORPHONE HCL 1 MG/ML IJ SOLN
0.5000 mg | Freq: Once | INTRAMUSCULAR | Status: AC
  Administered 2019-03-15: 18:00:00 0.5 mg via INTRAMUSCULAR
  Filled 2019-03-15: qty 1

## 2019-03-15 NOTE — Care Management (Signed)
ED CM attempted to meet with patient to discuss assistance with transitional care follow up with finding a PCP, patient was discharge from of room.  CM attempted to contact patient by phone number listed but this was an incorrect number.

## 2019-03-15 NOTE — ED Notes (Signed)
MD at bedside. 

## 2019-03-15 NOTE — ED Notes (Signed)
Discharge instructions and prescription discussed with Pt. Pt verbalized understanding. Pt stable and ambulatory.  Pt leaving with family.

## 2019-03-15 NOTE — Progress Notes (Signed)
Consult request has been received. CSW attempting to follow up at present time  Pratik Dalziel M. Clotilde Loth LCSWA Transitions of Care  Clinical Social Worker  Ph: [phone] 

## 2019-03-15 NOTE — ED Provider Notes (Signed)
[facility] EMERGENCY DEPARTMENT Provider Note   CSN: [id number] Arrival date & time: 03/15/19  1050     History   Chief Complaint Chief Complaint  Patient presents with  . Sciatica  . Chest Pain  . Hypertension    HPI Gina Cummings is a 53 y.o. female.     53 yo F with a chief complaint of chronic back pain.  This is been going on for some time.  She is seen in the ED a couple days ago.  The patient unfortunately has recently moved to the area due to her daughter having a intracranial procedure due to recurrent seizures.  The patient is now helping to take care of her daughter's child.  She unfortunately has been unable to establish with a family doctor.  She feels that her back pain is worse from sitting in the chair is upstairs in the hospital.  She denies any loss of bowel or bladder denies loss of peritoneal sensation denies fevers.  Denies recent procedure to her back.  Denies prior surgery to her back.  Denies recent injection.  Denies abdominal pain.  The patient also has been having some left-sided sharp chest pain.  This seems to come and go at random.  Not exertional.  No shortness of breath.  No diaphoresis.  No significant change in the past 6 to 12 hours.  The history is provided by the patient.  Chest Pain Associated symptoms: back pain   Associated symptoms: no dizziness, no fever, no headache, no nausea, no palpitations, no shortness of breath and no vomiting   Hypertension Associated symptoms include chest pain. Pertinent negatives include no headaches and no shortness of breath.  Illness Severity:  Moderate Onset quality:  Gradual Duration:  2 days Timing:  Constant Progression:  Worsening Chronicity:  Chronic Associated symptoms: chest pain   Associated symptoms: no congestion, no fever, no headaches, no myalgias, no nausea, no rhinorrhea, no shortness of breath, no vomiting and no wheezing     Past Medical History:  Diagnosis Date   . Asthma   . Chronic back pain   . Chronic, continuous use of opioids   . Drug-seeking behavior   . Hypertension   . Stroke [facility](HCC)     There are no active problems to display for this patient.   Past Surgical History:  Procedure Laterality Date  . ABDOMINAL HYSTERECTOMY    . CHOLECYSTECTOMY    . KNEE SURGERY       OB History   No obstetric history on file.      Home Medications    Prior to Admission medications   Medication Sig Start Date End Date Taking? Authorizing Provider  albuterol (VENTOLIN HFA) 108 (90 Base) MCG/ACT inhaler Inhale 1-2 puffs into the lungs every 4 (four) hours as needed for shortness of breath. 02/09/19 02/10/20  [provider]  amLODipine (NORVASC) 10 MG tablet Take 0.5 tablets (5 mg total) by mouth daily. 03/13/19   Mancel BaleWentz, Elliott, MD  ciclesonide (ALVESCO) 160 MCG/ACT inhaler Inhale 1 puff into the lungs 2 (two) times daily. 02/09/19 02/10/20  [provider]  cloNIDine (CATAPRES) 0.1 MG tablet Take 1 tablet (0.1 mg total) by mouth 2 (two) times daily. 02/14/19   Ward, Layla MawKristen N, DO  hydrochlorothiazide (HYDRODIURIL) 25 MG tablet Take 1 tablet (25 mg total) by mouth daily. 02/14/19   Ward, Layla MawKristen N, DO  lidocaine (LIDODERM) 5 % Place 1 patch onto the skin daily. Remove & Discard patch  within 12 hours or as directed by MD 10/02/17   Nicanor AlconPalumbo, April, MD  ondansetron (ZOFRAN ODT) 4 MG disintegrating tablet Take 1 tablet (4 mg total) by mouth every 8 (eight) hours as needed for nausea or vomiting. 02/10/19   Melene PlanFloyd, Juancarlos Crescenzo, DO  oxyCODONE (ROXICODONE) 15 MG immediate release tablet Take 1 tablet (15 mg total) by mouth every 4 (four) hours as needed. 03/15/19   Melene PlanFloyd, Ayoub Arey, DO  predniSONE (DELTASONE) 20 MG tablet Take 1 tablet (20 mg total) by mouth 2 (two) times daily. 03/13/19   Mancel BaleWentz, Elliott, MD  promethazine (PHENERGAN) 12.5 MG tablet Take 1 tablet (12.5 mg total) by mouth every 6 (six) hours as needed for nausea or vomiting. 02/09/19   Petrucelli, Pleas KochSamantha R,  PA-C    Family History No family history on file.  Social History Social History   Tobacco Use  . Smoking status: Never Smoker  . Smokeless tobacco: Never Used  Substance Use Topics  . Alcohol use: Never    Frequency: Never  . Drug use: Never     Allergies   Peanut-containing drug products, Penicillins, Aspirin, Gabapentin, Ibuprofen, Lisinopril, Morphine and related, Nsaids, Shellfish allergy, Tramadol, Trazodone, Fentanyl, and Morphine   Review of Systems Review of Systems  Constitutional: Negative for chills and fever.  HENT: Negative for congestion and rhinorrhea.   Eyes: Negative for redness and visual disturbance.  Respiratory: Negative for shortness of breath and wheezing.   Cardiovascular: Positive for chest pain. Negative for palpitations.  Gastrointestinal: Negative for nausea and vomiting.  Genitourinary: Negative for dysuria and urgency.  Musculoskeletal: Positive for back pain. Negative for arthralgias and myalgias.  Skin: Negative for pallor and wound.  Neurological: Negative for dizziness and headaches.     Physical Exam Updated Vital Signs BP (!) 169/85   Pulse 81   Temp 98.7 F (37.1 C) (Oral)   Resp 16   SpO2 100%   Physical Exam Vitals signs and nursing note reviewed.  Constitutional:      General: She is not in acute distress.    Appearance: She is well-developed. She is not diaphoretic.  HENT:     Head: Normocephalic and atraumatic.  Eyes:     Pupils: Pupils are equal, round, and reactive to light.  Neck:     Musculoskeletal: Normal range of motion and neck supple.  Cardiovascular:     Rate and Rhythm: Normal rate and regular rhythm.     Heart sounds: No murmur. No friction rub. No gallop.   Pulmonary:     Effort: Pulmonary effort is normal.     Breath sounds: No wheezing or rales.  Abdominal:     General: There is no distension.     Palpations: Abdomen is soft.     Tenderness: There is no abdominal tenderness.     Comments:  Reflexes are 2+ and equal to bilateral lower extremities.  No clonus.  Pulse motor and sensation are intact bilaterally.  Musculoskeletal:        General: No tenderness.  Skin:    General: Skin is warm and dry.  Neurological:     Mental Status: She is alert and oriented to person, place, and time.  Psychiatric:        Behavior: Behavior normal.      ED Treatments / Results  Labs (all labs ordered are listed, but only abnormal results are displayed) Labs Reviewed  BASIC METABOLIC PANEL - Abnormal; Notable for the following components:      Result Value  CO2 20 (*)    BUN 26 (*)    All other components within normal limits  CBC  TROPONIN I (HIGH SENSITIVITY)  TROPONIN I (HIGH SENSITIVITY)    EKG EKG Interpretation  Date/Time:  Tuesday March 15 2019 11:02:58 EDT Ventricular Rate:  80 PR Interval:  128 QRS Duration: 94 QT Interval:  388 QTC Calculation: 447 R Axis:   50 Text Interpretation:  Normal sinus rhythm Nonspecific ST abnormality Abnormal ECG No significant change since last tracing Confirmed by Deno Etienne [phone]) on 03/15/2019 4:38:37 PM   Radiology Dg Chest 2 View  Result Date: 03/15/2019 CLINICAL DATA:  Chest pain.  Hypertension. EXAM: CHEST - 2 VIEW COMPARISON:  February 08, 2019 FINDINGS: Lungs are clear. Heart is upper normal in size with pulmonary vascularity normal. No adenopathy. No pneumothorax. There is degenerative change in the thoracic spine. IMPRESSION: No edema or consolidation.  Heart upper normal in size. Electronically Signed   By: Lowella Grip III M.D.   On: 03/15/2019 11:30    Procedures Procedures (including critical care time)  Medications Ordered in ED Medications  acetaminophen (TYLENOL) tablet 1,000 mg (has no administration in time range)  ketorolac (TORADOL) injection 15 mg (has no administration in time range)  diazepam (VALIUM) tablet 5 mg (has no administration in time range)  HYDROmorphone (DILAUDID) injection 0.5 mg (has  no administration in time range)     Initial Impression / Assessment and Plan / ED Course  I have reviewed the triage vital signs and the nursing notes.  Pertinent labs & imaging results that were available during my care of the patient were reviewed by me and considered in my medical decision making (see chart for details).        53 yo F with 2 complaints.  Patient has atypical chest pain.  Going on since yesterday.  No significant change in about 6 hours.  Her troponin is negative EKG with no significant finding.  I feel this is unlikely to be a PE or other significant pathology.  Chest x-ray viewed by me without focal true pneumothorax.  No significant anemia.  Patient also is complaining of chronic low back pain.  This is recurrent issue for her.  I strongly encouraged her to follow-up with her primary care provider.  When I last saw her in the ED she was having withdrawal type symptoms.  Today she appears to be much better.  She has hypertension but no tachycardia.  I will give her a one-time treatment here in the ED.  PCP follow-up.  5:43 PM:  I have discussed the diagnosis/risks/treatment options with the patient and family and believe the pt to be eligible for discharge home to follow-up with PCP. We also discussed returning to the ED immediately if new or worsening sx occur. We discussed the sx which are most concerning (e.g., sudden worsening pain, fever, inability to tolerate by mouth) that necessitate immediate return. Medications administered to the patient during their visit and any new prescriptions provided to the patient are listed below.  Medications given during this visit Medications  acetaminophen (TYLENOL) tablet 1,000 mg (has no administration in time range)  ketorolac (TORADOL) injection 15 mg (has no administration in time range)  diazepam (VALIUM) tablet 5 mg (has no administration in time range)  HYDROmorphone (DILAUDID) injection 0.5 mg (has no administration in  time range)     The patient appears reasonably screen and/or stabilized for discharge and I doubt any other medical condition or other [facility]  requiring further screening, evaluation, or treatment in the ED at this time prior to discharge.    Final Clinical Impressions(s) / ED Diagnoses   Final diagnoses:  Chronic bilateral low back pain with bilateral sciatica  Opioid use disorder [facility])    ED Discharge Orders         Ordered    oxyCODONE (ROXICODONE) 15 MG immediate release tablet  Every 4 hours PRN     03/15/19 1739           Melene Plan, DO 03/15/19 1743

## 2019-03-15 NOTE — ED Triage Notes (Signed)
C/o sciatica pain and chest pain.  Patient became tearful and very overwhelmed.  She is unable to get into her drs and continues to have pain  Her daughter had brain surgery last week and reports having difficulty managing stress right now.

## 2019-04-15 ENCOUNTER — Emergency Department (HOSPITAL_COMMUNITY): Payer: Medicare HMO

## 2019-04-15 ENCOUNTER — Other Ambulatory Visit: Payer: Self-pay

## 2019-04-15 ENCOUNTER — Encounter (HOSPITAL_COMMUNITY): Payer: Self-pay | Admitting: Emergency Medicine

## 2019-04-15 ENCOUNTER — Emergency Department (HOSPITAL_COMMUNITY)
Admission: EM | Admit: 2019-04-15 | Discharge: 2019-04-15 | Disposition: A | Discharge status: Home / Self Care | Payer: Medicare HMO | Attending: Emergency Medicine | Admitting: Emergency Medicine

## 2019-04-15 DIAGNOSIS — Z20828 Contact with and (suspected) exposure to other viral communicable diseases: Secondary | ICD-10-CM | POA: Insufficient documentation

## 2019-04-15 DIAGNOSIS — R0789 Other chest pain: Secondary | ICD-10-CM

## 2019-04-15 DIAGNOSIS — J45909 Unspecified asthma, uncomplicated: Secondary | ICD-10-CM | POA: Insufficient documentation

## 2019-04-15 DIAGNOSIS — Z79899 Other long term (current) drug therapy: Secondary | ICD-10-CM | POA: Insufficient documentation

## 2019-04-15 DIAGNOSIS — M549 Dorsalgia, unspecified: Secondary | ICD-10-CM | POA: Diagnosis present

## 2019-04-15 DIAGNOSIS — R0602 Shortness of breath: Secondary | ICD-10-CM | POA: Insufficient documentation

## 2019-04-15 DIAGNOSIS — R05 Cough: Secondary | ICD-10-CM | POA: Diagnosis not present

## 2019-04-15 DIAGNOSIS — I10 Essential (primary) hypertension: Secondary | ICD-10-CM | POA: Diagnosis not present

## 2019-04-15 DIAGNOSIS — R059 Cough, unspecified: Secondary | ICD-10-CM

## 2019-04-15 LAB — CBC WITH DIFFERENTIAL/PLATELET
Abs Immature Granulocytes: 0.01 10*3/uL (ref 0.00–0.07)
Basophils Absolute: 0 10*3/uL (ref 0.0–0.1)
Basophils Relative: 1 %
Eosinophils Absolute: 0 10*3/uL (ref 0.0–0.5)
Eosinophils Relative: 1 %
HCT: 40 % (ref 36.0–46.0)
Hemoglobin: 13.1 g/dL (ref 12.0–15.0)
Immature Granulocytes: 0 %
Lymphocytes Relative: 41 %
Lymphs Abs: 1.1 10*3/uL (ref 0.7–4.0)
MCH: 31 pg (ref 26.0–34.0)
MCHC: 32.8 g/dL (ref 30.0–36.0)
MCV: 94.6 fL (ref 80.0–100.0)
Monocytes Absolute: 0.3 10*3/uL (ref 0.1–1.0)
Monocytes Relative: 10 %
Neutro Abs: 1.2 10*3/uL — ABNORMAL LOW (ref 1.7–7.7)
Neutrophils Relative %: 47 %
Platelets: 270 10*3/uL (ref 150–400)
RBC: 4.23 MIL/uL (ref 3.87–5.11)
RDW: 12.9 % (ref 11.5–15.5)
WBC: 2.6 10*3/uL — ABNORMAL LOW (ref 4.0–10.5)
nRBC: 0 % (ref 0.0–0.2)

## 2019-04-15 LAB — COMPREHENSIVE METABOLIC PANEL
ALT: 23 U/L (ref 0–44)
AST: 25 U/L (ref 15–41)
Albumin: 3.5 g/dL (ref 3.5–5.0)
Alkaline Phosphatase: 74 U/L (ref 38–126)
Anion gap: 8 (ref 5–15)
BUN: 6 mg/dL (ref 6–20)
CO2: 25 mmol/L (ref 22–32)
Calcium: 8.9 mg/dL (ref 8.9–10.3)
Chloride: 105 mmol/L (ref 98–111)
Creatinine, Ser: 0.74 mg/dL (ref 0.44–1.00)
GFR calc Af Amer: >60 mL/min (ref >60)
GFR calc non Af Amer: >60 mL/min (ref >60)
Glucose, Bld: 125 mg/dL — ABNORMAL HIGH (ref 70–99)
Potassium: 3.6 mmol/L (ref 3.5–5.1)
Sodium: 138 mmol/L (ref 135–145)
Total Bilirubin: 0.8 mg/dL (ref 0.3–1.2)
Total Protein: 7.2 g/dL (ref 6.5–8.1)

## 2019-04-15 LAB — BRAIN NATRIURETIC PEPTIDE: B Natriuretic Peptide: 172.2 pg/mL — ABNORMAL HIGH (ref 0.0–100.0)

## 2019-04-15 LAB — D-DIMER, QUANTITATIVE: D-Dimer, Quant: <0.27 ug/mL-FEU (ref 0.00–0.50)

## 2019-04-15 MED ORDER — BENZONATATE 100 MG PO CAPS: 100.0000 mg | ORAL_CAPSULE | Freq: Three times a day (TID) | ORAL | 0 refills | Status: DC | PRN

## 2019-04-15 MED ORDER — HYDROMORPHONE HCL 1 MG/ML IJ SOLN
0.5000 mg | Freq: Once | INTRAMUSCULAR | Status: AC
  Administered 2019-04-15: 0.5 mg via INTRAVENOUS
  Filled 2019-04-15: qty 1

## 2019-04-15 MED ORDER — AMLODIPINE BESYLATE 5 MG PO TABS
5.0000 mg | ORAL_TABLET | Freq: Once | ORAL | Status: AC
  Administered 2019-04-15: 5 mg via ORAL
  Filled 2019-04-15: qty 1

## 2019-04-15 MED ORDER — ONDANSETRON HCL 4 MG PO TABS: 4.0000 mg | ORAL_TABLET | Freq: Three times a day (TID) | ORAL | 0 refills | Status: DC | PRN

## 2019-04-15 MED ORDER — LIDOCAINE 5 % EX PTCH
2.0000 | MEDICATED_PATCH | CUTANEOUS | Status: DC
  Filled 2019-04-15: qty 2

## 2019-04-15 MED ORDER — KETOROLAC TROMETHAMINE 30 MG/ML IJ SOLN
30.0000 mg | Freq: Once | INTRAMUSCULAR | Status: AC
  Administered 2019-04-15: 30 mg via INTRAVENOUS
  Filled 2019-04-15: qty 1

## 2019-04-15 NOTE — Discharge Instructions (Addendum)
Continue to take all of your medications as prescribed.  Drink plenty fluids and get plenty of rest.  Take Zofran as needed for nausea and vomiting.  Wait around 10 or 15 minutes after taking this medication to give this medicine time to work.  Can take Tessalon as needed for cough.  Follow-up with your primary care provider for reevaluation of your symptoms and to follow-up on your slightly abnormal blood work and high blood pressure.  Your COVID test will result within about 48 hours.  You will receive a phone call if your test is positive, no phone call if your test is negative.  You can also see your results on my chart online.  Continue to quarantine at home for at least 14 days since your symptoms began per current CDC guidelines.  Return to the emergency department immediately if any concerning signs or symptoms develop such as worsening pain, shortness of breath, persistent vomiting, or loss of consciousness.  If your blood pressure (BP) was elevated on multiple readings during this visit above 130 for the top number or above 80 for the bottom number, please have this repeated by your primary care provider within one month. You can also check your blood pressure when you are out at a pharmacy or grocery store. Many have machines that will check your blood pressure.  If your blood pressure remains elevated, please follow-up with your PCP.

## 2019-04-15 NOTE — ED Provider Notes (Signed)
[facility] DEPART[facility] Provider Note   CSN: [id number] Arrival date & time: 04/15/19  1157     History   Chief Complaint Chief Complaint  Patient presents with  . Back Pain  . Cough    HPI LYNNSEY BARBARA is a 53 y.o. female with history of chronic back pain, hypertension, stroke, asthma presents for evaluation of acute onset, progressively worsening lower chest pain for 1 week. Reports constant sharp pains to this area that "everyone says is my sciatic pain". She reports pain is consistent with prior thoracic back pain she is experiencing has been seen in the ED for this multiple times.  However she does note development of pleuritic right-sided chest pains and shortness of breath yesterday.  Reports that she returned from [place] at the end of last month.  Denies prior history of DVT or PE, no hemoptysis, no hormonal placement therapy.  She is prescribed oxycodone 15 mg tablets which she has been taking without relief of her symptoms.  She denies fever, abdominal pain, or vomiting.  Has had some mild nausea. She does note development of mild nonproductive cough, few episodes of watery nonbloody diarrhea, subjective fevers and chills but has not checked her temperature.  She reports no known sick contacts or known exposures to COVID-19.     The history is provided by the patient.    Past Medical History:  Diagnosis Date  . Asthma   . Chronic back pain   . Chronic, continuous use of opioids   . Drug-seeking behavior   . Hypertension   . Stroke [facility])     There are no active problems to display for this patient.   Past Surgical History:  Procedure Laterality Date  . ABDOMINAL HYSTERECTOMY    . CHOLECYSTECTOMY    . KNEE SURGERY       OB History   No obstetric history on file.      Home Medications    Prior to Admission medications   Medication Sig Start Date End Date Taking? Authorizing Provider  albuterol (VENTOLIN HFA) 108 (90 Base)  MCG/ACT inhaler Inhale 1-2 puffs into the lungs every 4 (four) hours as needed for shortness of breath. 02/09/19 02/10/20  [provider]  amLODipine (NORVASC) 10 MG tablet Take 0.5 tablets (5 mg total) by mouth daily. 03/13/19   Daleen Bo, MD  benzonatate (TESSALON) 100 MG capsule Take 1 capsule (100 mg total) by mouth 3 (three) times daily as needed for cough. 04/15/19   Thaddeaus Monica A, PA-C  ciclesonide (ALVESCO) 160 MCG/ACT inhaler Inhale 1 puff into the lungs 2 (two) times daily. 02/09/19 02/10/20  [provider]  cloNIDine (CATAPRES) 0.1 MG tablet Take 1 tablet (0.1 mg total) by mouth 2 (two) times daily. 02/14/19   Ward, Delice Bison, DO  hydrochlorothiazide (HYDRODIURIL) 25 MG tablet Take 1 tablet (25 mg total) by mouth daily. 02/14/19   Ward, Delice Bison, DO  lidocaine (LIDODERM) 5 % Place 1 patch onto the skin daily. Remove & Discard patch within 12 hours or as directed by MD 10/02/17   Randal Buba, April, MD  ondansetron (ZOFRAN ODT) 4 MG disintegrating tablet Take 1 tablet (4 mg total) by mouth every 8 (eight) hours as needed for nausea or vomiting. 02/10/19   Deno Etienne, DO  ondansetron (ZOFRAN) 4 MG tablet Take 1 tablet (4 mg total) by mouth every 8 (eight) hours as needed for nausea or vomiting. 04/15/19   Nils Flack, Shashwat Cleary A, PA-C  oxyCODONE (ROXICODONE)  15 MG immediate release tablet Take 1 tablet (15 mg total) by mouth every 4 (four) hours as needed. 03/15/19   Melene Plan, DO  predniSONE (DELTASONE) 20 MG tablet Take 1 tablet (20 mg total) by mouth 2 (two) times daily. 03/13/19   Mancel Bale, MD  promethazine (PHENERGAN) 12.5 MG tablet Take 1 tablet (12.5 mg total) by mouth every 6 (six) hours as needed for nausea or vomiting. 02/09/19   Petrucelli, Pleas Koch, PA-C    Family History No family history on file.  Social History Social History   Tobacco Use  . Smoking status: Never Smoker  . Smokeless tobacco: Never Used  Substance Use Topics  . Alcohol use: Never    Frequency: Never  .  Drug use: Never     Allergies   Peanut-containing drug products, Penicillins, Aspirin, Gabapentin, Ibuprofen, Lisinopril, Morphine and related, Nsaids, Shellfish allergy, Tramadol, Trazodone, Fentanyl, and Morphine   Review of Systems Review of Systems  Constitutional: Positive for chills, fatigue and fever.  HENT: Positive for congestion.   Respiratory: Positive for cough and shortness of breath.   Gastrointestinal: Positive for diarrhea. Negative for abdominal pain, nausea and vomiting.  All other systems reviewed and are negative.    Physical Exam Updated Vital Signs BP (!) 170/87   Pulse 65   Temp 98.7 F (37.1 C) (Oral)   Resp 18   SpO2 98%   Physical Exam Vitals signs and nursing note reviewed.  Constitutional:      General: She is not in acute distress.    Appearance: She is well-developed.  HENT:     Head: Normocephalic and atraumatic.  Eyes:     General:        Right eye: No discharge.        Left eye: No discharge.     Conjunctiva/sclera: Conjunctivae normal.  Neck:     Vascular: No JVD.     Trachea: No tracheal deviation.  Cardiovascular:     Rate and Rhythm: Normal rate and regular rhythm.  Pulmonary:     Effort: Pulmonary effort is normal.     Breath sounds: Normal breath sounds.     Comments: Bilateral anterior chest wall tenderness with no deformity, crepitus, ecchymosis or flail segment.  Speaking in full sentences without difficulty. Chest:    Abdominal:     General: Abdomen is flat. There is no distension.     Palpations: Abdomen is soft.     Tenderness: There is no abdominal tenderness. There is no guarding or rebound.  Skin:    Findings: No erythema.  Neurological:     Mental Status: She is alert.  Psychiatric:        Behavior: Behavior normal.      ED Treatments / Results  Labs (all labs ordered are listed, but only abnormal results are displayed) Labs Reviewed  COMPREHENSIVE METABOLIC PANEL - Abnormal; Notable for the  following components:      Result Value   Glucose, Bld 125 (*)    All other components within normal limits  CBC WITH DIFFERENTIAL/PLATELET - Abnormal; Notable for the following components:   WBC 2.6 (*)    Neutro Abs 1.2 (*)    All other components within normal limits  BRAIN NATRIURETIC PEPTIDE - Abnormal; Notable for the following components:   B Natriuretic Peptide 172.2 (*)    All other components within normal limits  NOVEL CORONAVIRUS, NAA (HOSP ORDER, SEND-OUT TO REF LAB; TAT 18-24 HRS)  D-DIMER, QUANTITATIVE (NOT AT  [facility])    EKG EKG Interpretation  Date/Time:  Friday April 15 2019 20:25:22 EDT Ventricular Rate:  69 PR Interval:    QRS Duration: 111 QT Interval:  434 QTC Calculation: 465 R Axis:   55 Text Interpretation:  Sinus rhythm Ventricular premature complex Left atrial enlargement Confirmed by Benjiman Core [phone]) on 04/15/2019 11:30:18 PM   Radiology Dg Chest Portable 1 View  Result Date: 04/15/2019 CLINICAL DATA:  chest pain EXAM: PORTABLE CHEST 1 VIEW COMPARISON:  Chest radiograph 02/08/2019 FINDINGS: Normal mediastinal contours. The heart size appears enlarged which may be accentuated by AP technique. The lungs are clear. No pneumothorax or large pleural effusion. No acute finding in the visualized skeleton. IMPRESSION: 1. Heart size appears enlarged which may be accentuated by AP technique. 2.  No evidence of active disease. Electronically Signed   By: Emmaline Kluver M.D.   On: 04/15/2019 18:19    Procedures Procedures (including critical care time)  Medications Ordered in ED Medications  lidocaine (LIDODERM) 5 % 2 patch (2 patches Transdermal Refused 04/15/19 1919)  amLODipine (NORVASC) tablet 5 mg (5 mg Oral Given 04/15/19 1921)  ketorolac (TORADOL) 30 MG/ML injection 30 mg (30 mg Intravenous Given 04/15/19 1919)  HYDROmorphone (DILAUDID) injection 0.5 mg (0.5 mg Intravenous Given 04/15/19 2006)  HYDROmorphone (DILAUDID) injection 0.5 mg (0.5 mg  Intravenous Given 04/15/19 2114)     Initial Impression / Assessment and Plan / ED Course  I have reviewed the triage vital signs and the nursing notes.  Pertinent labs & imaging results that were available during my care of the patient were reviewed by me and considered in my medical decision making (see chart for details).       UMAIZA MATUSIK was evaluated in Emergency Department on 04/15/2019 for the symptoms described in the history of present illness. She was evaluated in the context of the global COVID-19 pandemic, which necessitated consideration that the patient might be at risk for infection with the SARS-CoV-2 virus that causes COVID-19. Institutional protocols and algorithms that pertain to the evaluation of patients at risk for COVID-19 are in a state of rapid change based on information released by regulatory bodies including the CDC and federal and state organizations. These policies and algorithms were followed during the patient's care in the ED.  Patient presenting for evaluation of chest wall pain reproducible on palpation, nonspecific viral symptoms including cough, diarrhea, subjective fevers and chills.  Recently traveled from [place].  Unable to use PERC criteria due to age and recent travel. Symptoms seem highly atypical for ACS/MI. EKG shows nonspecific T wave inversions, no acute ischemic abnormalities or arrhythmia.   Remainder of labs reviewed by me show leukopenia however she has been leukopenic previously upon chart review.  No anemia, no metabolic derangements, no renal insufficiency.  D-dimer is negative and I have a low suspicion of PE at this time.  Her chest x-ray shows possible cardiomegaly though this could be related to x-ray technique.  BNP was very mildly elevated and she has no obvious signs of volume overload or peripheral edema.  SPO2 saturations are stable while she has been in the ED and she was ambulated in the ED with stable O2 saturations.  She has  been persistently hypertensive in the ED but reports she did not take any of her medications due to nausea.  She was able to tolerate her home amlodipine without difficulty and states that she has plenty of her antihypertensive medications at home.  She does [facility]  [facility]  Encouraged her to follow-up with her PCP for reevaluation of her hypertension and for evaluation of possible CHF though I do not think she requires initiation of diuretics at this time given reassuring work-up.  Doubt  dissection, cardiac tamponade, esophageal rupture, or pneumothorax.  Pain was managed while in the ED.  Discussed strict ED return precautions. Patient verbalized understanding of and agreement with plan and is safe for discharge home at this time.   Final Clinical Impressions(s) / ED Diagnoses   Final diagnoses:  Chest wall pain  Cough  Hypertension, unspecified type    ED Discharge Orders         Ordered    ondansetron (ZOFRAN) 4 MG tablet  Every 8 hours PRN     04/15/19 2105    benzonatate (TESSALON) 100 MG capsule  3 times daily PRN     04/15/19 2105           Jeanie SewerFawze, Eduarda Scrivens A, PA-C 04/16/19 Loman [id number]    Pickering, Nathan, MD 04/16/19 [phone]

## 2019-04-15 NOTE — ED Triage Notes (Signed)
Pt states that she has had bilateral thoracic back pain since 9/28 after traveling to [place]. Pt reports mild cough, runny nose, chills and diarrhea. resp e/u, nad.

## 2019-04-15 NOTE — ED Notes (Signed)
Discharge instructions discussed with pt. pt verbalized understanding with no questions at this time. Pt to go home with family.  

## 2019-04-15 NOTE — ED Notes (Signed)
PT updated on status.

## 2019-04-15 NOTE — ED Notes (Signed)
Pt ambulated around the room. Pulse oxygen stayed at 98-100%

## 2019-04-17 LAB — NOVEL CORONAVIRUS, NAA (HOSP ORDER, SEND-OUT TO REF LAB; TAT 18-24 HRS): SARS-CoV-2, NAA: NOT DETECTED

## 2019-04-22 ENCOUNTER — Other Ambulatory Visit: Payer: Self-pay

## 2019-04-22 ENCOUNTER — Emergency Department (HOSPITAL_COMMUNITY)
Admission: EM | Admit: 2019-04-22 | Discharge: 2019-04-22 | Disposition: A | Discharge status: Home / Self Care | Payer: Medicare HMO | Attending: Emergency Medicine | Admitting: Emergency Medicine

## 2019-04-22 ENCOUNTER — Encounter (HOSPITAL_COMMUNITY): Payer: Self-pay | Admitting: Emergency Medicine

## 2019-04-22 ENCOUNTER — Emergency Department (HOSPITAL_COMMUNITY): Payer: Medicare HMO

## 2019-04-22 DIAGNOSIS — R0789 Other chest pain: Secondary | ICD-10-CM | POA: Diagnosis present

## 2019-04-22 DIAGNOSIS — Z79899 Other long term (current) drug therapy: Secondary | ICD-10-CM | POA: Insufficient documentation

## 2019-04-22 DIAGNOSIS — M7918 Myalgia, other site: Secondary | ICD-10-CM

## 2019-04-22 DIAGNOSIS — I10 Essential (primary) hypertension: Secondary | ICD-10-CM | POA: Insufficient documentation

## 2019-04-22 DIAGNOSIS — Z91010 Allergy to peanuts: Secondary | ICD-10-CM | POA: Insufficient documentation

## 2019-04-22 DIAGNOSIS — Z8673 Personal history of transient ischemic attack (TIA), and cerebral infarction without residual deficits: Secondary | ICD-10-CM | POA: Diagnosis not present

## 2019-04-22 DIAGNOSIS — J45909 Unspecified asthma, uncomplicated: Secondary | ICD-10-CM | POA: Diagnosis not present

## 2019-04-22 LAB — TROPONIN I (HIGH SENSITIVITY): Troponin I (High Sensitivity): 3 ng/L (ref <18)

## 2019-04-22 LAB — CBC
HCT: 39.9 % (ref 36.0–46.0)
Hemoglobin: 13.3 g/dL (ref 12.0–15.0)
MCH: 31.1 pg (ref 26.0–34.0)
MCHC: 33.3 g/dL (ref 30.0–36.0)
MCV: 93.2 fL (ref 80.0–100.0)
Platelets: 309 10*3/uL (ref 150–400)
RBC: 4.28 MIL/uL (ref 3.87–5.11)
RDW: 13.1 % (ref 11.5–15.5)
WBC: 3.8 10*3/uL — ABNORMAL LOW (ref 4.0–10.5)
nRBC: 0 % (ref 0.0–0.2)

## 2019-04-22 LAB — BASIC METABOLIC PANEL
Anion gap: 11 (ref 5–15)
BUN: 11 mg/dL (ref 6–20)
CO2: 20 mmol/L — ABNORMAL LOW (ref 22–32)
Calcium: 8.7 mg/dL — ABNORMAL LOW (ref 8.9–10.3)
Chloride: 106 mmol/L (ref 98–111)
Creatinine, Ser: 0.64 mg/dL (ref 0.44–1.00)
GFR calc Af Amer: >60 mL/min (ref >60)
GFR calc non Af Amer: >60 mL/min (ref >60)
Glucose, Bld: 68 mg/dL — ABNORMAL LOW (ref 70–99)
Potassium: 3.9 mmol/L (ref 3.5–5.1)
Sodium: 137 mmol/L (ref 135–145)

## 2019-04-22 MED ORDER — MORPHINE SULFATE (PF) 4 MG/ML IV SOLN
8.0000 mg | Freq: Once | INTRAVENOUS | Status: AC
  Administered 2019-04-22: 8 mg via INTRAMUSCULAR
  Filled 2019-04-22: qty 2

## 2019-04-22 MED ORDER — DIAZEPAM 5 MG PO TABS
5.0000 mg | ORAL_TABLET | Freq: Once | ORAL | Status: AC
  Administered 2019-04-22: 5 mg via ORAL
  Filled 2019-04-22: qty 1

## 2019-04-22 MED ORDER — DIPHENHYDRAMINE HCL 50 MG/ML IJ SOLN
25.0000 mg | Freq: Once | INTRAMUSCULAR | Status: AC
  Administered 2019-04-22: 25 mg via INTRAMUSCULAR
  Filled 2019-04-22: qty 1

## 2019-04-22 NOTE — ED Provider Notes (Signed)
[facility]-EMERGENCY DEPT Provider Note   CSN: [id number] Arrival date & time: 04/22/19  1749     History   Chief Complaint Chief Complaint  Patient presents with  . Chest Pain    HPI DONNA SNOOKS is a 53 y.o. female.     53 year old female who presents with recurring back discomfort which radiates to her chest.  Pain starts in her mid thoracic back and radiates to her anterior chest.  Denies any associated anginal symptoms such as dyspnea exertion, diaphoresis, nausea or vomiting.  No rashes noted.  Has been evaluated multiple times for this without a etiology.  She is not pain management currently for chronic back pain and is on 15 mg of OxyContin which she says is not helping.  No bowel or bladder dysfunction.  Pain worse with movement and better with certain positions     Past Medical History:  Diagnosis Date  . Asthma   . Chronic back pain   . Chronic, continuous use of opioids   . Drug-seeking behavior   . Hypertension   . Stroke [facility])     There are no active problems to display for this patient.   Past Surgical History:  Procedure Laterality Date  . ABDOMINAL HYSTERECTOMY    . CHOLECYSTECTOMY    . KNEE SURGERY       OB History   No obstetric history on file.      Home Medications    Prior to Admission medications   Medication Sig Start Date End Date Taking? Authorizing Provider  albuterol (VENTOLIN HFA) 108 (90 Base) MCG/ACT inhaler Inhale 1-2 puffs into the lungs every 4 (four) hours as needed for shortness of breath. 02/09/19 02/10/20  [provider]  amLODipine (NORVASC) 10 MG tablet Take 0.5 tablets (5 mg total) by mouth daily. 03/13/19   Mancel Bale, MD  benzonatate (TESSALON) 100 MG capsule Take 1 capsule (100 mg total) by mouth 3 (three) times daily as needed for cough. 04/15/19   Fawze, Mina A, PA-C  ciclesonide (ALVESCO) 160 MCG/ACT inhaler Inhale 1 puff into the lungs 2 (two) times daily. 02/09/19 02/10/20   [provider]  cloNIDine (CATAPRES) 0.1 MG tablet Take 1 tablet (0.1 mg total) by mouth 2 (two) times daily. 02/14/19   Ward, Layla Maw, DO  hydrochlorothiazide (HYDRODIURIL) 25 MG tablet Take 1 tablet (25 mg total) by mouth daily. 02/14/19   Ward, Layla Maw, DO  lidocaine (LIDODERM) 5 % Place 1 patch onto the skin daily. Remove & Discard patch within 12 hours or as directed by MD 10/02/17   Nicanor Alcon, April, MD  ondansetron (ZOFRAN ODT) 4 MG disintegrating tablet Take 1 tablet (4 mg total) by mouth every 8 (eight) hours as needed for nausea or vomiting. 02/10/19   Melene Plan, DO  ondansetron (ZOFRAN) 4 MG tablet Take 1 tablet (4 mg total) by mouth every 8 (eight) hours as needed for nausea or vomiting. 04/15/19   Fawze, Mina A, PA-C  oxyCODONE (ROXICODONE) 15 MG immediate release tablet Take 1 tablet (15 mg total) by mouth every 4 (four) hours as needed. 03/15/19   Melene Plan, DO  predniSONE (DELTASONE) 20 MG tablet Take 1 tablet (20 mg total) by mouth 2 (two) times daily. 03/13/19   Mancel Bale, MD  promethazine (PHENERGAN) 12.5 MG tablet Take 1 tablet (12.5 mg total) by mouth every 6 (six) hours as needed for nausea or vomiting. 02/09/19   Petrucelli, Pleas Koch, PA-C    Family History  No family history on file.  Social History Social History   Tobacco Use  . Smoking status: Never Smoker  . Smokeless tobacco: Never Used  Substance Use Topics  . Alcohol use: Never    Frequency: Never  . Drug use: Never     Allergies   Peanut-containing drug products, Penicillins, Aspirin, Gabapentin, Ibuprofen, Lisinopril, Morphine and related, Nsaids, Shellfish allergy, Tramadol, Trazodone, Fentanyl, and Morphine   Review of Systems Review of Systems  All other systems reviewed and are negative.    Physical Exam Updated Vital Signs BP (!) 200/109 (BP Location: Right Arm)   Pulse 87   Temp 98 F (36.7 C) (Oral)   Resp 18   SpO2 100%   Physical Exam Vitals signs and nursing note  reviewed.  Constitutional:      General: She is not in acute distress.    Appearance: Normal appearance. She is well-developed. She is not toxic-appearing.  HENT:     Head: Normocephalic and atraumatic.  Eyes:     General: Lids are normal.     Conjunctiva/sclera: Conjunctivae normal.     Pupils: Pupils are equal, round, and reactive to light.  Neck:     Musculoskeletal: Normal range of motion and neck supple.     Thyroid: No thyroid mass.     Trachea: No tracheal deviation.  Cardiovascular:     Rate and Rhythm: Normal rate and regular rhythm.     Heart sounds: Normal heart sounds. No murmur. No gallop.   Pulmonary:     Effort: Pulmonary effort is normal. No respiratory distress.     Breath sounds: Normal breath sounds. No stridor. No decreased breath sounds, wheezing, rhonchi or rales.  Abdominal:     General: Bowel sounds are normal. There is no distension.     Palpations: Abdomen is soft.     Tenderness: There is no abdominal tenderness. There is no rebound.  Musculoskeletal: Normal range of motion.        General: No tenderness.       Back:  Skin:    General: Skin is warm and dry.     Findings: No abrasion or rash.  Neurological:     Mental Status: She is alert and oriented to person, place, and time.     GCS: GCS eye subscore is 4. GCS verbal subscore is 5. GCS motor subscore is 6.     Cranial Nerves: No cranial nerve deficit.     Sensory: No sensory deficit.  Psychiatric:        Speech: Speech normal.        Behavior: Behavior normal.      ED Treatments / Results  Labs (all labs ordered are listed, but only abnormal results are displayed) Labs Reviewed  BASIC METABOLIC PANEL  CBC  TROPONIN I (HIGH SENSITIVITY)    EKG EKG Interpretation  Date/Time:  Friday April 22 2019 18:03:42 EDT Ventricular Rate:  94 PR Interval:    QRS Duration: 96 QT Interval:  376 QTC Calculation: 471 R Axis:   63 Text Interpretation:  Sinus rhythm Ventricular bigeminy  Probable left atrial enlargement Minimal ST depression, inferior leads Minimal ST elevation, lateral leads Confirmed by Lacretia Leigh ([id number]) on 04/22/2019 6:48:45 PM   Radiology Dg Chest 2 View  Result Date: 04/22/2019 CLINICAL DATA:  Chest pain EXAM: CHEST - 2 VIEW COMPARISON:  Radiograph 04/15/2011 FINDINGS: No consolidation, features of edema, pneumothorax, or effusion. Pulmonary vascularity is normally distributed. The cardiomediastinal contours are unremarkable. No acute  osseous or soft tissue abnormality. IMPRESSION: No acute cardiopulmonary abnormality. Electronically Signed   By: Kreg ShropshirePrice  DeHay M.D.   On: 04/22/2019 18:42    Procedures Procedures (including critical care time)  Medications Ordered in ED Medications  morphine 4 MG/ML injection 8 mg (has no administration in time range)  diazepam (VALIUM) tablet 5 mg (has no administration in time range)  diphenhydrAMINE (BENADRYL) injection 25 mg (has no administration in time range)     Initial Impression / Assessment and Plan / ED Course  I have reviewed the triage vital signs and the nursing notes.  Pertinent labs & imaging results that were available during my care of the patient were reviewed by me and considered in my medical decision making (see chart for details).        Patient with likely musculoskeletal pain and treated with Valium as well as morphine and feels better.  Troponin negative and she has had pain for over 6 hours therefore no need to repeat it.  Chest x-ray without acute findings.  EKG without acute changes.  Will discharge home Final Clinical Impressions(s) / ED Diagnoses   Final diagnoses:  None    ED Discharge Orders    None       Lorre NickAllen, Desi Carby, MD 04/22/19 2053

## 2019-04-22 NOTE — ED Notes (Signed)
ED Provider at bedside. 

## 2019-04-22 NOTE — ED Notes (Addendum)
Patient given warm blanket.

## 2019-04-22 NOTE — ED Triage Notes (Signed)
Per pt, states she was seen at [facility] on 10/9 for same symptoms-states she was diagnosed with an enlarged heart-states having chest pain and loose stool

## 2019-05-17 ENCOUNTER — Other Ambulatory Visit: Payer: Self-pay

## 2019-05-17 ENCOUNTER — Encounter (HOSPITAL_COMMUNITY): Payer: Self-pay | Admitting: Emergency Medicine

## 2019-05-17 ENCOUNTER — Emergency Department (HOSPITAL_COMMUNITY)
Admission: EM | Admit: 2019-05-17 | Discharge: 2019-05-18 | Disposition: A | Discharge status: Home / Self Care | Payer: Medicare HMO | Attending: Emergency Medicine | Admitting: Emergency Medicine

## 2019-05-17 DIAGNOSIS — R1110 Vomiting, unspecified: Secondary | ICD-10-CM | POA: Insufficient documentation

## 2019-05-17 DIAGNOSIS — R197 Diarrhea, unspecified: Secondary | ICD-10-CM | POA: Insufficient documentation

## 2019-05-17 DIAGNOSIS — R1084 Generalized abdominal pain: Secondary | ICD-10-CM | POA: Insufficient documentation

## 2019-05-17 DIAGNOSIS — Z5321 Procedure and treatment not carried out due to patient leaving prior to being seen by health care provider: Secondary | ICD-10-CM | POA: Diagnosis not present

## 2019-05-17 DIAGNOSIS — R1032 Left lower quadrant pain: Secondary | ICD-10-CM | POA: Diagnosis present

## 2019-05-17 LAB — CBC WITH DIFFERENTIAL/PLATELET
Abs Immature Granulocytes: 0 10*3/uL (ref 0.00–0.07)
Basophils Absolute: 0 10*3/uL (ref 0.0–0.1)
Basophils Relative: 1 %
Eosinophils Absolute: 0 10*3/uL (ref 0.0–0.5)
Eosinophils Relative: 0 %
HCT: 37.3 % (ref 36.0–46.0)
Hemoglobin: 12.8 g/dL (ref 12.0–15.0)
Immature Granulocytes: 0 %
Lymphocytes Relative: 42 %
Lymphs Abs: 1.5 10*3/uL (ref 0.7–4.0)
MCH: 31.3 pg (ref 26.0–34.0)
MCHC: 34.3 g/dL (ref 30.0–36.0)
MCV: 91.2 fL (ref 80.0–100.0)
Monocytes Absolute: 0.3 10*3/uL (ref 0.1–1.0)
Monocytes Relative: 9 %
Neutro Abs: 1.7 10*3/uL (ref 1.7–7.7)
Neutrophils Relative %: 48 %
Platelets: 347 10*3/uL (ref 150–400)
RBC: 4.09 MIL/uL (ref 3.87–5.11)
RDW: 13.1 % (ref 11.5–15.5)
WBC: 3.5 10*3/uL — ABNORMAL LOW (ref 4.0–10.5)
nRBC: 0 % (ref 0.0–0.2)

## 2019-05-17 LAB — URINALYSIS, ROUTINE W REFLEX MICROSCOPIC
Bilirubin Urine: NEGATIVE
Glucose, UA: NEGATIVE mg/dL
Hgb urine dipstick: NEGATIVE
Ketones, ur: NEGATIVE mg/dL
Leukocytes,Ua: NEGATIVE
Nitrite: NEGATIVE
Protein, ur: NEGATIVE mg/dL
Specific Gravity, Urine: 1.016 (ref 1.005–1.030)
pH: 8 (ref 5.0–8.0)

## 2019-05-17 NOTE — ED Triage Notes (Signed)
Patient reports left flank pain and generalized abdominal pain with emesis and diarrhea onset this week , denies fever or chills .

## 2019-05-18 ENCOUNTER — Emergency Department (HOSPITAL_COMMUNITY)
Admission: EM | Admit: 2019-05-18 | Discharge: 2019-05-18 | Disposition: A | Discharge status: Home / Self Care | Payer: Medicare HMO | Source: Home / Self Care | Attending: Emergency Medicine | Admitting: Emergency Medicine

## 2019-05-18 ENCOUNTER — Other Ambulatory Visit: Payer: Self-pay

## 2019-05-18 ENCOUNTER — Emergency Department (HOSPITAL_COMMUNITY): Payer: Medicare HMO

## 2019-05-18 ENCOUNTER — Encounter (HOSPITAL_COMMUNITY): Payer: Self-pay

## 2019-05-18 DIAGNOSIS — Z8673 Personal history of transient ischemic attack (TIA), and cerebral infarction without residual deficits: Secondary | ICD-10-CM | POA: Insufficient documentation

## 2019-05-18 DIAGNOSIS — R1032 Left lower quadrant pain: Secondary | ICD-10-CM | POA: Insufficient documentation

## 2019-05-18 DIAGNOSIS — Z91010 Allergy to peanuts: Secondary | ICD-10-CM | POA: Insufficient documentation

## 2019-05-18 DIAGNOSIS — J45909 Unspecified asthma, uncomplicated: Secondary | ICD-10-CM | POA: Insufficient documentation

## 2019-05-18 DIAGNOSIS — I10 Essential (primary) hypertension: Secondary | ICD-10-CM | POA: Insufficient documentation

## 2019-05-18 DIAGNOSIS — Z79899 Other long term (current) drug therapy: Secondary | ICD-10-CM | POA: Insufficient documentation

## 2019-05-18 DIAGNOSIS — R1110 Vomiting, unspecified: Secondary | ICD-10-CM | POA: Insufficient documentation

## 2019-05-18 LAB — COMPREHENSIVE METABOLIC PANEL
ALT: 25 U/L (ref 0–44)
AST: 30 U/L (ref 15–41)
Albumin: 3.7 g/dL (ref 3.5–5.0)
Alkaline Phosphatase: 78 U/L (ref 38–126)
Anion gap: 14 (ref 5–15)
BUN: 11 mg/dL (ref 6–20)
CO2: 25 mmol/L (ref 22–32)
Calcium: 9.1 mg/dL (ref 8.9–10.3)
Chloride: 99 mmol/L (ref 98–111)
Creatinine, Ser: 0.73 mg/dL (ref 0.44–1.00)
GFR calc Af Amer: >60 mL/min (ref >60)
GFR calc non Af Amer: >60 mL/min (ref >60)
Glucose, Bld: 80 mg/dL (ref 70–99)
Potassium: 3.3 mmol/L — ABNORMAL LOW (ref 3.5–5.1)
Sodium: 138 mmol/L (ref 135–145)
Total Bilirubin: 0.5 mg/dL (ref 0.3–1.2)
Total Protein: 7.2 g/dL (ref 6.5–8.1)

## 2019-05-18 MED ORDER — KETOROLAC TROMETHAMINE 30 MG/ML IJ SOLN
30.0000 mg | Freq: Once | INTRAMUSCULAR | Status: AC
  Administered 2019-05-18: 30 mg via INTRAVENOUS
  Filled 2019-05-18: qty 1

## 2019-05-18 MED ORDER — IOHEXOL 300 MG/ML  SOLN
100.0000 mL | Freq: Once | INTRAMUSCULAR | Status: AC | PRN
  Administered 2019-05-18: 100 mL via INTRAVENOUS

## 2019-05-18 MED ORDER — HALOPERIDOL LACTATE 5 MG/ML IJ SOLN
2.0000 mg | Freq: Once | INTRAMUSCULAR | Status: AC
  Administered 2019-05-18: 2 mg via INTRAVENOUS
  Filled 2019-05-18: qty 1

## 2019-05-18 MED ORDER — SODIUM CHLORIDE (PF) 0.9 % IJ SOLN
INTRAMUSCULAR | Status: AC
  Administered 2019-05-18: 08:00:00
  Filled 2019-05-18: qty 50

## 2019-05-18 MED ORDER — SODIUM CHLORIDE 0.9 % IV BOLUS
1000.0000 mL | Freq: Once | INTRAVENOUS | Status: AC
  Administered 2019-05-18: 1000 mL via INTRAVENOUS

## 2019-05-18 MED ORDER — OXYCODONE HCL 5 MG PO TABS
15.0000 mg | ORAL_TABLET | Freq: Once | ORAL | Status: AC
  Administered 2019-05-18: 15 mg via ORAL
  Filled 2019-05-18: qty 3

## 2019-05-18 MED ORDER — DICYCLOMINE HCL 20 MG PO TABS: 20.0000 mg | ORAL_TABLET | Freq: Two times a day (BID) | ORAL | 0 refills | Status: DC

## 2019-05-18 MED ORDER — DICYCLOMINE HCL 10 MG PO CAPS
10.0000 mg | ORAL_CAPSULE | Freq: Once | ORAL | Status: AC
  Administered 2019-05-18: 10 mg via ORAL
  Filled 2019-05-18: qty 1

## 2019-05-18 NOTE — ED Triage Notes (Signed)
Pt complains of left lower abdominal pain for a few days, no hx of kidney stones, pt also reports vomiting and diarrhea

## 2019-05-18 NOTE — Discharge Instructions (Addendum)
Make sure to eat a bland diet.  There is information about this in the paperwork. Use Tylenol and your home pain medications for pain control. Use Bentyl to help with abdominal pain. Make sure you are staying well-hydrated water. Follow-up with your primary care doctor next week as needed for reevaluation of your symptoms. Return to the emergency room if you develop high fevers, persistent vomiting despite medication, or any new, worsening, or concerning symptoms.

## 2019-05-18 NOTE — ED Notes (Signed)
Pt stated she was sick of waiting that she would never come here again. Tech seen pt leaving out of ED and in a vehicle

## 2019-05-18 NOTE — ED Notes (Signed)
Pt transported to CT ?

## 2019-05-18 NOTE — ED Provider Notes (Signed)
[facility]-EMERGENCY DEPT Provider Note   CSN: [id number] Arrival date & time: 05/18/19  0449     History   Chief Complaint No chief complaint on file.   HPI Gina Cummings [name] is a 53 y.o. female.     53 year old female with a history of chronic pain, drug-seeking behavior, hypertension presents to the emergency department for evaluation of abdominal pain x2 days.  Reports pain is in her left lower abdomen.  It is nonradiating and has been fairly constant.  Has been taking her home oxycodone prescribed by her pain clinic, but this has been providing no pain relief.  She has had 2 episodes of vomiting in the past 24 hours.  Denies hematemesis.  Further noting too numerous to count episodes of watery, nonbloody diarrhea.  She has never had a colonoscopy.  Abdominal surgical history significant for hysterectomy and cholecystectomy.  Denies fevers, sick contacts, urinary symptoms. Had labs performed at [facility] tonight, but LWBS due to wait times.  The history is provided by the patient. No language interpreter was used.    Past Medical History:  Diagnosis Date  . Asthma   . Chronic back pain   . Chronic, continuous use of opioids   . Drug-seeking behavior   . Hypertension   . Stroke [facility])     There are no active problems to display for this patient.   Past Surgical History:  Procedure Laterality Date  . ABDOMINAL HYSTERECTOMY    . CHOLECYSTECTOMY    . KNEE SURGERY       OB History   No obstetric history on file.      Home Medications    Prior to Admission medications   Medication Sig Start Date End Date Taking? Authorizing Provider  albuterol (VENTOLIN HFA) 108 (90 Base) MCG/ACT inhaler Inhale 1-2 puffs into the lungs every 4 (four) hours as needed for shortness of breath. 02/09/19 02/10/20  [provider]  amLODipine (NORVASC) 10 MG tablet Take 0.5 tablets (5 mg total) by mouth daily. 03/13/19   Mancel Bale, MD  benzonatate (TESSALON) 100  MG capsule Take 1 capsule (100 mg total) by mouth 3 (three) times daily as needed for cough. Patient not taking: Reported on 04/22/2019 04/15/19   Michela Pitcher A, PA-C  ciclesonide (ALVESCO) 160 MCG/ACT inhaler Inhale 1 puff into the lungs 2 (two) times daily. 02/09/19 02/10/20  [provider]  cloNIDine (CATAPRES) 0.1 MG tablet Take 1 tablet (0.1 mg total) by mouth 2 (two) times daily. 02/14/19   Ward, Layla Maw, DO  hydrochlorothiazide (HYDRODIURIL) 25 MG tablet Take 1 tablet (25 mg total) by mouth daily. 02/14/19   Ward, Layla Maw, DO  lidocaine (LIDODERM) 5 % Place 1 patch onto the skin daily. Remove & Discard patch within 12 hours or as directed by MD Patient not taking: Reported on 04/22/2019 10/02/17   Palumbo, April, MD  ondansetron (ZOFRAN ODT) 4 MG disintegrating tablet Take 1 tablet (4 mg total) by mouth every 8 (eight) hours as needed for nausea or vomiting. Patient not taking: Reported on 04/22/2019 02/10/19   Melene Plan, DO  ondansetron (ZOFRAN) 4 MG tablet Take 1 tablet (4 mg total) by mouth every 8 (eight) hours as needed for nausea or vomiting. 04/15/19   Fawze, Mina A, PA-C  oxyCODONE (ROXICODONE) 15 MG immediate release tablet Take 1 tablet (15 mg total) by mouth every 4 (four) hours as needed. 03/15/19   Melene Plan, DO  predniSONE (DELTASONE) 20 MG tablet Take 1  tablet (20 mg total) by mouth 2 (two) times daily. Patient not taking: Reported on 04/22/2019 03/13/19   Daleen Bo, MD  promethazine (PHENERGAN) 12.5 MG tablet Take 1 tablet (12.5 mg total) by mouth every 6 (six) hours as needed for nausea or vomiting. Patient not taking: Reported on 04/22/2019 02/09/19   Petrucelli, Glynda Jaeger, PA-C    Family History History reviewed. No pertinent family history.  Social History Social History   Tobacco Use  . Smoking status: Never Smoker  . Smokeless tobacco: Never Used  Substance Use Topics  . Alcohol use: Never    Frequency: Never  . Drug use: Never     Allergies    Peanut-containing drug products, Penicillins, Aspirin, Gabapentin, Ibuprofen, Lisinopril, Morphine and related, Nsaids, Shellfish allergy, Tramadol, Trazodone, Fentanyl, and Morphine   Review of Systems Review of Systems Ten systems reviewed and are negative for acute change, except as noted in the HPI.    Physical Exam Updated Vital Signs BP (!) 190/101 (BP Location: Left Arm)   Pulse 90   Temp 98 F (36.7 C) (Oral)   Resp 20   SpO2 100%   Physical Exam Vitals signs and nursing note reviewed.  Constitutional:      General: She is not in acute distress.    Appearance: She is well-developed. She is not diaphoretic.     Comments: Appears uncomfortable, but nontoxic  HENT:     Head: Normocephalic and atraumatic.  Eyes:     General: No scleral icterus.    Conjunctiva/sclera: Conjunctivae normal.  Neck:     Musculoskeletal: Normal range of motion.  Pulmonary:     Effort: Pulmonary effort is normal. No respiratory distress.     Comments: Respirations even and unlabored Abdominal:     Tenderness: There is abdominal tenderness.     Comments: Left mid and lower abdominal tenderness to palpation.  Mild guarding on palpation of the left lower quadrant.  No peritoneal signs or palpable masses.  Abdomen is soft and obese.  Musculoskeletal: Normal range of motion.  Skin:    General: Skin is warm and dry.     Coloration: Skin is not pale.     Findings: No erythema or rash.  Neurological:     General: No focal deficit present.     Mental Status: She is alert and oriented to person, place, and time.     Coordination: Coordination normal.     Comments: Moving all extremities spontaneously  Psychiatric:        Behavior: Behavior normal.      ED Treatments / Results  Labs (all labs ordered are listed, but only abnormal results are displayed) Labs Reviewed - No data to display  EKG None  Radiology No results found.  Procedures Procedures (including critical care time)   Medications Ordered in ED Medications  sodium chloride (PF) 0.9 % injection (has no administration in time range)  ketorolac (TORADOL) 30 MG/ML injection 30 mg (30 mg Intravenous Given 05/18/19 0611)  haloperidol lactate (HALDOL) injection 2 mg (2 mg Intravenous Given 05/18/19 0611)  sodium chloride 0.9 % bolus 1,000 mL (1,000 mLs Intravenous New Bag/Given 05/18/19 4481)  oxyCODONE (Oxy IR/ROXICODONE) immediate release tablet 15 mg (15 mg Oral Given 05/18/19 8563)     Initial Impression / Assessment and Plan / ED Course  I have reviewed the triage vital signs and the nursing notes.  Pertinent labs & imaging results that were available during my care of the patient were reviewed by me  and considered in my medical decision making (see chart for details).        53 year old female presents to the emergency department for evaluation of left lower quadrant abdominal pain.  [facility], but left due to wait times.  Labs drawn in triage at this facility were normal.  Her symptoms have been managed with Toradol, Haldol, IV fluids.  Also given a tablet of her home oxycodone.  Pending abdominal CT to rule out diverticulitis.  Patient signed out to Caccavale, PA-C at change of shift who will follow-up on imaging and disposition appropriately.   Final Clinical Impressions(s) / ED Diagnoses   Final diagnoses:  Left lower quadrant abdominal pain    ED Discharge Orders    None       Antony MaduraHumes, Abri Vacca, PA-C 05/18/19 [id number]    Palumbo, April, MD 05/18/19 [id number]

## 2019-05-18 NOTE — ED Provider Notes (Signed)
Physical Exam  BP (!) 152/81 (BP Location: Left Arm)   Pulse 75   Temp 98 F (36.7 C) (Oral)   Resp 15   SpO2 100%   Physical Exam  Gen: appears nontoxic  ED Course/Procedures     Procedures  MDM  Patient signed out to me by K. Humes, PA-C.  Please see previous notes for further history.  In brief, patient presenting for evaluation of abdominal pain, vomiting, and diarrhea.  Lab work obtained earlier [employer] was reassuring.  CT pending.  CT negative for diverticulitis or intra-abdominal infection.  Does show abnormality of the bile duct, status post cholecystectomy.  Patient without any right upper quadrant pain or symptoms consistent with biliary issues.  I discussed findings including abnormality of the biliary duct with patient.  Discussed supportive care.  Will discharge with Bentyl and close follow-up with primary care.  Discussed importance of hydration and bland foods.  At this time, patient appears safe for discharge.  Return precautions given.  Patient states she understands and agrees to plan.  Results for orders placed or performed during the hospital encounter of 05/17/19  CBC with Differential  Result Value Ref Range   WBC 3.5 (L) 4.0 - 10.5 K/uL   RBC 4.09 3.87 - 5.11 MIL/uL   Hemoglobin 12.8 12.0 - 15.0 g/dL   HCT 37.3 36.0 - 46.0 %   MCV 91.2 80.0 - 100.0 fL   MCH 31.3 26.0 - 34.0 pg   MCHC 34.3 30.0 - 36.0 g/dL   RDW 13.1 11.5 - 15.5 %   Platelets 347 150 - 400 K/uL   nRBC 0.0 0.0 - 0.2 %   Neutrophils Relative % 48 %   Neutro Abs 1.7 1.7 - 7.7 K/uL   Lymphocytes Relative 42 %   Lymphs Abs 1.5 0.7 - 4.0 K/uL   Monocytes Relative 9 %   Monocytes Absolute 0.3 0.1 - 1.0 K/uL   Eosinophils Relative 0 %   Eosinophils Absolute 0.0 0.0 - 0.5 K/uL   Basophils Relative 1 %   Basophils Absolute 0.0 0.0 - 0.1 K/uL   Immature Granulocytes 0 %   Abs Immature Granulocytes 0.00 0.00 - 0.07 K/uL  Comprehensive metabolic panel  Result Value Ref Range   Sodium 138  135 - 145 mmol/L   Potassium 3.3 (L) 3.5 - 5.1 mmol/L   Chloride 99 98 - 111 mmol/L   CO2 25 22 - 32 mmol/L   Glucose, Bld 80 70 - 99 mg/dL   BUN 11 6 - 20 mg/dL   Creatinine, Ser 0.73 0.44 - 1.00 mg/dL   Calcium 9.1 8.9 - 10.3 mg/dL   Total Protein 7.2 6.5 - 8.1 g/dL   Albumin 3.7 3.5 - 5.0 g/dL   AST 30 15 - 41 U/L   ALT 25 0 - 44 U/L   Alkaline Phosphatase 78 38 - 126 U/L   Total Bilirubin 0.5 0.3 - 1.2 mg/dL   GFR calc non Af Amer >60 >60 mL/min   GFR calc Af Amer >60 >60 mL/min   Anion gap 14 5 - 15  Urinalysis, Routine w reflex microscopic  Result Value Ref Range   Color, Urine YELLOW YELLOW   APPearance CLEAR CLEAR   Specific Gravity, Urine 1.016 1.005 - 1.030   pH 8.0 5.0 - 8.0   Glucose, UA NEGATIVE NEGATIVE mg/dL   Hgb urine dipstick NEGATIVE NEGATIVE   Bilirubin Urine NEGATIVE NEGATIVE   Ketones, ur NEGATIVE NEGATIVE mg/dL   Protein, ur NEGATIVE  NEGATIVE mg/dL   Nitrite NEGATIVE NEGATIVE   Leukocytes,Ua NEGATIVE NEGATIVE   Dg Chest 2 View  Result Date: 04/22/2019 CLINICAL DATA:  Chest pain EXAM: CHEST - 2 VIEW COMPARISON:  Radiograph 04/15/2011 FINDINGS: No consolidation, features of edema, pneumothorax, or effusion. Pulmonary vascularity is normally distributed. The cardiomediastinal contours are unremarkable. No acute osseous or soft tissue abnormality. IMPRESSION: No acute cardiopulmonary abnormality. Electronically Signed   By: Kreg Shropshire M.D.   On: 04/22/2019 18:42   Ct Abdomen Pelvis W Contrast  Result Date: 05/18/2019 CLINICAL DATA:  Abdominal pain.  Diverticulitis suspected. EXAM: CT ABDOMEN AND PELVIS WITH CONTRAST TECHNIQUE: Multidetector CT imaging of the abdomen and pelvis was performed using the standard protocol following bolus administration of intravenous contrast. CONTRAST:  OMNIPAQUE IOHEXOL 300 MG/ML  SOLN COMPARISON:  None. FINDINGS: Lower chest: No acute abnormality. Hepatobiliary: Anterior dome of liver cyst measures 1.8 cm. Previous  cholecystectomy. Mild intrahepatic bile duct dilatation. The common bile duct measures 1.5 cm, image 51/5. There is abrupt tapering of the distal CBD, image 52/5. No mass or choledocholithiasis noted. Pancreas: Unremarkable. No pancreatic ductal dilatation or surrounding inflammatory changes. Spleen: Normal in size without focal abnormality. Adrenals/Urinary Tract: Normal appearance of the adrenal glands. The kidneys are unremarkable. No mass or hydronephrosis identified. The urinary bladder appears normal. Stomach/Bowel: Stomach is within normal limits. Appendix appears normal. No evidence of bowel wall thickening, distention, or inflammatory changes. Vascular/Lymphatic: Aortic atherosclerosis. No abdominopelvic adenopathy. Reproductive: Status post hysterectomy. No adnexal masses. Other: No free fluid or fluid collections identified. Musculoskeletal: No acute or significant osseous findings. IMPRESSION: 1. No acute findings within the abdomen or pelvis. 2. Increase caliber of the common bile duct which measures 15 mm, status post cholecystectomy. 3.  Aortic Atherosclerosis (ICD10-I70.0). Electronically Signed   By: Signa Kell M.D.   On: 05/18/2019 07:51          Alveria Apley, PA-C 05/18/19 4259    Gwyneth Sprout, MD 05/18/19 1541

## 2019-07-17 ENCOUNTER — Other Ambulatory Visit: Payer: Self-pay

## 2019-07-17 ENCOUNTER — Emergency Department (HOSPITAL_COMMUNITY)
Admission: EM | Admit: 2019-07-17 | Discharge: 2019-07-17 | Disposition: A | Discharge status: Home / Self Care | Payer: Medicare HMO | Attending: Emergency Medicine | Admitting: Emergency Medicine

## 2019-07-17 ENCOUNTER — Emergency Department (HOSPITAL_COMMUNITY): Payer: Medicare HMO

## 2019-07-17 ENCOUNTER — Encounter (HOSPITAL_COMMUNITY): Payer: Self-pay

## 2019-07-17 DIAGNOSIS — R197 Diarrhea, unspecified: Secondary | ICD-10-CM | POA: Diagnosis not present

## 2019-07-17 DIAGNOSIS — M7918 Myalgia, other site: Secondary | ICD-10-CM | POA: Insufficient documentation

## 2019-07-17 DIAGNOSIS — J45909 Unspecified asthma, uncomplicated: Secondary | ICD-10-CM | POA: Diagnosis not present

## 2019-07-17 DIAGNOSIS — Z79899 Other long term (current) drug therapy: Secondary | ICD-10-CM | POA: Insufficient documentation

## 2019-07-17 DIAGNOSIS — R059 Cough, unspecified: Secondary | ICD-10-CM

## 2019-07-17 DIAGNOSIS — I10 Essential (primary) hypertension: Secondary | ICD-10-CM | POA: Diagnosis not present

## 2019-07-17 DIAGNOSIS — Z20822 Contact with and (suspected) exposure to covid-19: Secondary | ICD-10-CM | POA: Diagnosis not present

## 2019-07-17 DIAGNOSIS — R05 Cough: Secondary | ICD-10-CM

## 2019-07-17 DIAGNOSIS — J029 Acute pharyngitis, unspecified: Secondary | ICD-10-CM | POA: Insufficient documentation

## 2019-07-17 DIAGNOSIS — R1110 Vomiting, unspecified: Secondary | ICD-10-CM | POA: Insufficient documentation

## 2019-07-17 LAB — CBC WITH DIFFERENTIAL/PLATELET
Abs Immature Granulocytes: 0 10*3/uL (ref 0.00–0.07)
Basophils Absolute: 0 10*3/uL (ref 0.0–0.1)
Basophils Relative: 1 %
Eosinophils Absolute: 0 10*3/uL (ref 0.0–0.5)
Eosinophils Relative: 0 %
HCT: 43.8 % (ref 36.0–46.0)
Hemoglobin: 14.5 g/dL (ref 12.0–15.0)
Immature Granulocytes: 0 %
Lymphocytes Relative: 47 %
Lymphs Abs: 1.2 10*3/uL (ref 0.7–4.0)
MCH: 31.5 pg (ref 26.0–34.0)
MCHC: 33.1 g/dL (ref 30.0–36.0)
MCV: 95 fL (ref 80.0–100.0)
Monocytes Absolute: 0.3 10*3/uL (ref 0.1–1.0)
Monocytes Relative: 12 %
Neutro Abs: 1 10*3/uL — ABNORMAL LOW (ref 1.7–7.7)
Neutrophils Relative %: 40 %
Platelets: 345 10*3/uL (ref 150–400)
RBC: 4.61 MIL/uL (ref 3.87–5.11)
RDW: 14.2 % (ref 11.5–15.5)
WBC: 2.6 10*3/uL — ABNORMAL LOW (ref 4.0–10.5)
nRBC: 0 % (ref 0.0–0.2)

## 2019-07-17 LAB — COMPREHENSIVE METABOLIC PANEL
ALT: 29 U/L (ref 0–44)
AST: 43 U/L — ABNORMAL HIGH (ref 15–41)
Albumin: 4.5 g/dL (ref 3.5–5.0)
Alkaline Phosphatase: 102 U/L (ref 38–126)
Anion gap: 12 (ref 5–15)
BUN: 9 mg/dL (ref 6–20)
CO2: 24 mmol/L (ref 22–32)
Calcium: 9.1 mg/dL (ref 8.9–10.3)
Chloride: 97 mmol/L — ABNORMAL LOW (ref 98–111)
Creatinine, Ser: 0.85 mg/dL (ref 0.44–1.00)
GFR calc Af Amer: >60 mL/min (ref >60)
GFR calc non Af Amer: >60 mL/min (ref >60)
Glucose, Bld: 104 mg/dL — ABNORMAL HIGH (ref 70–99)
Potassium: 3.8 mmol/L (ref 3.5–5.1)
Sodium: 133 mmol/L — ABNORMAL LOW (ref 135–145)
Total Bilirubin: 1.7 mg/dL — ABNORMAL HIGH (ref 0.3–1.2)
Total Protein: 8.6 g/dL — ABNORMAL HIGH (ref 6.5–8.1)

## 2019-07-17 LAB — LIPASE, BLOOD: Lipase: 21 U/L (ref 11–51)

## 2019-07-17 LAB — POC SARS CORONAVIRUS 2 AG -  ED: SARS Coronavirus 2 Ag: NEGATIVE

## 2019-07-17 MED ORDER — DIPHENHYDRAMINE HCL 12.5 MG/5ML PO ELIX
25.0000 mg | ORAL_SOLUTION | Freq: Once | ORAL | Status: AC
  Administered 2019-07-17: 25 mg via ORAL
  Filled 2019-07-17: qty 10

## 2019-07-17 MED ORDER — AMLODIPINE BESYLATE 5 MG PO TABS
10.0000 mg | ORAL_TABLET | Freq: Once | ORAL | Status: AC
  Administered 2019-07-17: 10 mg via ORAL
  Filled 2019-07-17: qty 2

## 2019-07-17 MED ORDER — PROCHLORPERAZINE EDISYLATE 10 MG/2ML IJ SOLN
10.0000 mg | Freq: Once | INTRAMUSCULAR | Status: AC
  Administered 2019-07-17: 10 mg via INTRAVENOUS
  Filled 2019-07-17: qty 2

## 2019-07-17 MED ORDER — HYDROCHLOROTHIAZIDE 25 MG PO TABS
25.0000 mg | ORAL_TABLET | Freq: Every day | ORAL | Status: DC
  Administered 2019-07-17: 25 mg via ORAL
  Filled 2019-07-17: qty 1

## 2019-07-17 MED ORDER — OXYCODONE-ACETAMINOPHEN 5-325 MG PO TABS
1.0000 | ORAL_TABLET | Freq: Once | ORAL | Status: AC
  Administered 2019-07-17: 1 via ORAL
  Filled 2019-07-17: qty 1

## 2019-07-17 MED ORDER — KETOROLAC TROMETHAMINE 15 MG/ML IJ SOLN
15.0000 mg | Freq: Once | INTRAMUSCULAR | Status: AC
  Administered 2019-07-17: 15 mg via INTRAVENOUS
  Filled 2019-07-17: qty 1

## 2019-07-17 NOTE — ED Provider Notes (Signed)
[facility]-EMERGENCY DEPT Provider Note   CSN: [id number] Arrival date & time: 07/17/19  3614     History Chief Complaint  Patient presents with  . Cough  . Generalized Body Aches    Gina Cummings is a 54 y.o. female.  54 y.o female with a PMH of Asthma, Stroke, Drug-seeking behavior presents to the ED with a chief complaint of generalized bodyaches x 3 days. Patient reports symptoms began with rhinorrhea, has had multiple episodes of emesis although none on today's visit.  Also endorses some chills, has had diarrhea which she reports her diarrhea has a foul smell but there is no blood noted.  She has tried some gargles for her sore throat without improvement in her symptoms.  Unknown if fever she does not have a thermometer.  The history is provided by the patient and medical records.  Cough Cough characteristics:  Dry Sputum characteristics:  Nondescript Severity:  Mild Onset quality:  Sudden Duration:  3 days Timing:  Constant Progression:  Worsening Chronicity:  New Relieved by:  Nothing Worsened by:  Nothing Ineffective treatments:  None tried Associated symptoms: rhinorrhea   Associated symptoms: no chest pain, no fever, no headaches, no sore throat and no wheezing        Past Medical History:  Diagnosis Date  . Asthma   . Chronic back pain   . Chronic, continuous use of opioids   . Drug-seeking behavior   . Hypertension   . Stroke [facility])     There are no problems to display for this patient.   Past Surgical History:  Procedure Laterality Date  . ABDOMINAL HYSTERECTOMY    . CHOLECYSTECTOMY    . KNEE SURGERY       OB History   No obstetric history on file.     History reviewed. No pertinent family history.  Social History   Tobacco Use  . Smoking status: Never Smoker  . Smokeless tobacco: Never Used  Substance Use Topics  . Alcohol use: Never  . Drug use: Never    Home Medications Prior to Admission medications    Medication Sig Start Date End Date Taking? Authorizing Provider  amLODipine (NORVASC) 10 MG tablet Take 0.5 tablets (5 mg total) by mouth daily. 03/13/19  Yes Mancel Bale, MD  cloNIDine (CATAPRES) 0.1 MG tablet Take 1 tablet (0.1 mg total) by mouth 2 (two) times daily. 02/14/19  Yes Ward, Layla Maw, DO  oxyCODONE (ROXICODONE) 15 MG immediate release tablet Take 1 tablet (15 mg total) by mouth every 4 (four) hours as needed. Patient taking differently: Take 15 mg by mouth every 4 (four) hours as needed for pain.  03/15/19  Yes Melene Plan, DO  benzonatate (TESSALON) 100 MG capsule Take 1 capsule (100 mg total) by mouth 3 (three) times daily as needed for cough. Patient not taking: Reported on 04/22/2019 04/15/19   Michela Pitcher A, PA-C  dicyclomine (BENTYL) 20 MG tablet Take 1 tablet (20 mg total) by mouth 2 (two) times daily. Patient not taking: Reported on 07/17/2019 05/18/19   Caccavale, Sophia, PA-C  hydrochlorothiazide (HYDRODIURIL) 25 MG tablet Take 1 tablet (25 mg total) by mouth daily. Patient not taking: Reported on 05/18/2019 02/14/19   Ward, Layla Maw, DO  lidocaine (LIDODERM) 5 % Place 1 patch onto the skin daily. Remove & Discard patch within 12 hours or as directed by MD Patient not taking: Reported on 04/22/2019 10/02/17   Palumbo, April, MD  ondansetron (ZOFRAN ODT) 4 MG disintegrating  tablet Take 1 tablet (4 mg total) by mouth every 8 (eight) hours as needed for nausea or vomiting. Patient not taking: Reported on 04/22/2019 02/10/19   Melene Plan, DO  ondansetron (ZOFRAN) 4 MG tablet Take 1 tablet (4 mg total) by mouth every 8 (eight) hours as needed for nausea or vomiting. Patient not taking: Reported on 07/17/2019 04/15/19   Michela Pitcher A, PA-C  predniSONE (DELTASONE) 20 MG tablet Take 1 tablet (20 mg total) by mouth 2 (two) times daily. Patient not taking: Reported on 04/22/2019 03/13/19   Mancel Bale, MD  promethazine (PHENERGAN) 12.5 MG tablet Take 1 tablet (12.5 mg total) by mouth every  6 (six) hours as needed for nausea or vomiting. Patient not taking: Reported on 04/22/2019 02/09/19   Petrucelli, Lelon Mast R, PA-C    Allergies    Peanut-containing drug products, Penicillins, Aspirin, Gabapentin, Ibuprofen, Lisinopril, Morphine and related, Nsaids, Shellfish allergy, Tramadol, Trazodone, Fentanyl, and Morphine  Review of Systems   Review of Systems  Constitutional: Negative for fever.  HENT: Positive for rhinorrhea. Negative for sore throat.   Respiratory: Positive for cough. Negative for wheezing.   Cardiovascular: Negative for chest pain.  Gastrointestinal: Positive for abdominal pain, diarrhea and vomiting. Negative for nausea.  Genitourinary: Negative for flank pain.  Musculoskeletal: Negative for back pain.  Skin: Negative for pallor and wound.  Neurological: Negative for headaches.    Physical Exam Updated Vital Signs BP (!) 158/88   Pulse 90   Temp 98.7 F (37.1 C) (Oral)   Resp (!) 21   Ht 4\' 11"  (1.499 m)   Wt 87.1 kg   SpO2 99%   BMI 38.78 kg/m   Physical Exam Vitals and nursing note reviewed.  Constitutional:      General: She is not in acute distress.    Appearance: She is well-developed.     Comments: Non ill,  nontoxic.  HENT:     Head: Normocephalic and atraumatic.     Mouth/Throat:     Pharynx: No oropharyngeal exudate.  Eyes:     Pupils: Pupils are equal, round, and reactive to light.  Cardiovascular:     Rate and Rhythm: Normal rate and regular rhythm.     Heart sounds: Normal heart sounds.  Pulmonary:     Effort: Pulmonary effort is normal. No respiratory distress.     Breath sounds: Normal breath sounds. No wheezing, rhonchi or rales.     Comments: Lungs are clear to auscultation without any wheezing, rhonchi, rales. Abdominal:     General: Bowel sounds are normal. There is no distension.     Palpations: Abdomen is soft.     Tenderness: There is no abdominal tenderness.  Musculoskeletal:        General: No tenderness or  deformity.     Cervical back: Normal range of motion.     Right lower leg: No edema.     Left lower leg: No edema.  Skin:    General: Skin is warm and dry.  Neurological:     Mental Status: She is alert and oriented to person, place, and time.     ED Results / Procedures / Treatments   Labs (all labs ordered are listed, but only abnormal results are displayed) Labs Reviewed  CBC WITH DIFFERENTIAL/PLATELET - Abnormal; Notable for the following components:      Result Value   WBC 2.6 (*)    Neutro Abs 1.0 (*)    All other components within normal limits  COMPREHENSIVE  METABOLIC PANEL - Abnormal; Notable for the following components:   Sodium 133 (*)    Chloride 97 (*)    Glucose, Bld 104 (*)    Total Protein 8.6 (*)    AST 43 (*)    Total Bilirubin 1.7 (*)    All other components within normal limits  LIPASE, BLOOD  URINALYSIS, ROUTINE W REFLEX MICROSCOPIC  POC SARS CORONAVIRUS 2 AG -  ED    EKG None  Radiology DG Chest Portable 1 View  Result Date: 07/17/2019 CLINICAL DATA:  Cough and headache.  Vomiting. EXAM: PORTABLE CHEST 1 VIEW COMPARISON:  April 22, 2019 FINDINGS: The lungs are clear. Heart is upper normal in size with pulmonary vascularity normal. No adenopathy. There is degenerative change in the thoracic spine. IMPRESSION: Lungs clear.  Heart upper normal in size.  No evident adenopathy. Electronically Signed   By: Lowella Grip III M.D.   On: 07/17/2019 10:36    Procedures Procedures (including critical care time)  Medications Ordered in ED Medications  oxyCODONE-acetaminophen (PERCOCET/ROXICET) 5-325 MG per tablet 1 tablet (has no administration in time range)  amLODipine (NORVASC) tablet 10 mg (has no administration in time range)  hydrochlorothiazide (HYDRODIURIL) tablet 25 mg (has no administration in time range)  ketorolac (TORADOL) 15 MG/ML injection 15 mg (15 mg Intravenous Given 07/17/19 1137)  diphenhydrAMINE (BENADRYL) 12.5 MG/5ML elixir 25  mg (25 mg Oral Given 07/17/19 1137)  prochlorperazine (COMPAZINE) injection 10 mg (10 mg Intravenous Given 07/17/19 1137)    ED Course  I have reviewed the triage vital signs and the nursing notes.  Pertinent labs & imaging results that were available during my care of the patient were reviewed by me and considered in my medical decision making (see chart for details).    MDM Rules/Calculators/A&P   The past medical history of asthma, drug-seeking behavior presents to the ED with complaints of body aches, cough, rhinorrhea.  Patient reports she has been wheezing, during my evaluation patient is non-ill-appearing, she satting at 100% on room air, no tachypnea, tachycardia.  Patient reports she has taken some over-the-counter medication such as gargles which have help with her sore throat but symptoms persist.  Rapid COVID-19 was negative.  CBC with benign findings.  CMP showed mild hyponatremia. Creatine function is within normal limits. Lft's are within normal limits aside from slight elevation but stable. Lipase is within normal limits. Lungs are clear to auscultation, no wheezing noted on my exam. Patient was provided with HA cocktail. After reviewing her chart, she does have a history of drug-seeking behavior, according to her last documentation on June 13, 2019, patient was seeking PCP care with [facility], she was advised that she will need to prioritize her opioid disorders needed safe for management.  2:00 PM patient has been educated on findings from her labs along with results.  She is requesting BP medication, she is trying to schedule an appointment with a PCP in the upcoming week.  Will be provided with her pain meds along with her blood pressure medication at this time.  I have also given her the community health and wellness for further attain primary care.   Portions of this note were generated with [employer]. Dictation errors may occur despite  best attempts at proofreading.  Final Clinical Impression(s) / ED Diagnoses Final diagnoses:  Cough    Rx / DC Orders ED Discharge Orders    None       Janeece Fitting, PA-C 07/17/19  1400    Lorre Nick, MD 07/17/19 1431

## 2019-07-17 NOTE — ED Triage Notes (Signed)
Pt presents with c/o cough, generalized body aches, abdominal pain with vomiting, and headache. Pt reports she has been experiencing all of these things since Friday. Pt reports she does have asthma and feels like she has been wheezing.

## 2019-07-17 NOTE — ED Notes (Signed)
RN attempted IV/Bloodwork x2.

## 2019-07-17 NOTE — ED Notes (Signed)
Pt not able to give specimen at moment

## 2019-07-17 NOTE — Discharge Instructions (Signed)
Your chest x-ray today was within normal limits.  Your laboratory results were within normal limits today.  You may continue over-the-counter medication to help with your symptoms.  Your Covid test today was negative.

## 2019-10-04 ENCOUNTER — Emergency Department (HOSPITAL_BASED_OUTPATIENT_CLINIC_OR_DEPARTMENT_OTHER)
Admission: EM | Admit: 2019-10-04 | Discharge: 2019-10-04 | Disposition: A | Discharge status: Home / Self Care | Payer: Medicare HMO | Attending: Emergency Medicine | Admitting: Emergency Medicine

## 2019-10-04 ENCOUNTER — Emergency Department (HOSPITAL_COMMUNITY): Admission: EM | Admit: 2019-10-04 | Discharge: 2019-10-04 | Discharge status: Absent without leave | Payer: Medicare HMO

## 2019-10-04 ENCOUNTER — Other Ambulatory Visit: Payer: Self-pay

## 2019-10-04 ENCOUNTER — Emergency Department (HOSPITAL_BASED_OUTPATIENT_CLINIC_OR_DEPARTMENT_OTHER): Payer: Medicare HMO

## 2019-10-04 ENCOUNTER — Encounter (HOSPITAL_BASED_OUTPATIENT_CLINIC_OR_DEPARTMENT_OTHER): Payer: Self-pay | Admitting: Emergency Medicine

## 2019-10-04 DIAGNOSIS — M25561 Pain in right knee: Secondary | ICD-10-CM

## 2019-10-04 MED ORDER — OXYCODONE-ACETAMINOPHEN 5-325 MG PO TABS: 1.0000 | ORAL_TABLET | Freq: Four times a day (QID) | ORAL | 0 refills | Status: DC | PRN

## 2019-10-04 MED ORDER — SENNOSIDES-DOCUSATE SODIUM 8.6-50 MG PO TABS: 1.0000 | ORAL_TABLET | Freq: Every evening | ORAL | 0 refills | Status: DC | PRN

## 2019-10-04 MED ORDER — OXYCODONE-ACETAMINOPHEN 5-325 MG PO TABS
1.0000 | ORAL_TABLET | Freq: Once | ORAL | Status: AC
  Administered 2019-10-04: 1 via ORAL
  Filled 2019-10-04: qty 1

## 2019-10-04 NOTE — Discharge Instructions (Addendum)
You were seen today for right knee pain. Knee Xray showed moderate effusion and moderate arthritis. Take Percocet as needed for pain. Call Dr. Jordan Likes tomorrow for your knee. Come back to the ER if pain worsens, fevers, your knee becomes hot. Follow up with your PCP in one week.

## 2019-10-04 NOTE — ED Triage Notes (Signed)
Pt is c/o right leg pain  Pt states the pain is in and below the knee area  Pt has swelling noted  Pt states she is unable to bear weight due to the pain

## 2019-10-04 NOTE — ED Provider Notes (Signed)
[facility] Provider Note   CSN: [id number] Arrival date & time: 10/04/19  2154     History Chief Complaint  Patient presents with  . Leg Pain    Gina Cummings is a 54 y.o. female.  Pt presents with right knee pain that began three days ago. She describes the pain as sharp and shooting and is unable to bear weight on her right side. The pain started gradually and then became severe over the last three days. She states that she had similar pain about 9 months ago and was seen in [place]. She said that the doctors told her she needed surgery for that knee, but she was unable to do so at that time. She describes pain radiating up to her right hip. She states that the pain hurts the worse when she tries to take a step; she has fallen from her knee giving out on her about two days ago. She states that nothing relieves the pain. She has tried putting a knee brace on without relief. She denies any knee warmth, fevers, recent URI, dysuria, back pain. She had a family member driver her here.    Leg Pain      Past Medical History:  Diagnosis Date  . Asthma   . Chronic back pain   . Chronic, continuous use of opioids   . Drug-seeking behavior   . Hypertension   . Stroke [facility])     There are no problems to display for this patient.   Past Surgical History:  Procedure Laterality Date  . ABDOMINAL HYSTERECTOMY    . CHOLECYSTECTOMY    . KNEE SURGERY       OB History   No obstetric history on file.     Family History  Problem Relation Age of Onset  . Cancer Mother   . CAD Father   . CAD Maternal Grandmother     Social History   Tobacco Use  . Smoking status: Never Smoker  . Smokeless tobacco: Never Used  Substance Use Topics  . Alcohol use: Never  . Drug use: Never    Home Medications Prior to Admission medications   Medication Sig Start Date End Date Taking? Authorizing Provider  amLODipine (NORVASC) 10 MG tablet Take 0.5  tablets (5 mg total) by mouth daily. 03/13/19   Daleen Bo, MD  benzonatate (TESSALON) 100 MG capsule Take 1 capsule (100 mg total) by mouth 3 (three) times daily as needed for cough. Patient not taking: Reported on 04/22/2019 04/15/19   Rodell Perna A, PA-C  cloNIDine (CATAPRES) 0.1 MG tablet Take 1 tablet (0.1 mg total) by mouth 2 (two) times daily. 02/14/19   Ward, Delice Bison, DO  dicyclomine (BENTYL) 20 MG tablet Take 1 tablet (20 mg total) by mouth 2 (two) times daily. Patient not taking: Reported on 07/17/2019 05/18/19   Caccavale, Sophia, PA-C  hydrochlorothiazide (HYDRODIURIL) 25 MG tablet Take 1 tablet (25 mg total) by mouth daily. Patient not taking: Reported on 05/18/2019 02/14/19   Ward, Delice Bison, DO  lidocaine (LIDODERM) 5 % Place 1 patch onto the skin daily. Remove & Discard patch within 12 hours or as directed by MD Patient not taking: Reported on 04/22/2019 10/02/17   Palumbo, April, MD  ondansetron (ZOFRAN ODT) 4 MG disintegrating tablet Take 1 tablet (4 mg total) by mouth every 8 (eight) hours as needed for nausea or vomiting. Patient not taking: Reported on 04/22/2019 02/10/19   Deno Etienne, DO  ondansetron [facility] 4  MG tablet Take 1 tablet (4 mg total) by mouth every 8 (eight) hours as needed for nausea or vomiting. Patient not taking: Reported on 07/17/2019 04/15/19   Michela Pitcher A, PA-C  oxyCODONE (ROXICODONE) 15 MG immediate release tablet Take 1 tablet (15 mg total) by mouth every 4 (four) hours as needed. Patient taking differently: Take 15 mg by mouth every 4 (four) hours as needed for pain.  03/15/19   Melene Plan, DO  predniSONE (DELTASONE) 20 MG tablet Take 1 tablet (20 mg total) by mouth 2 (two) times daily. Patient not taking: Reported on 04/22/2019 03/13/19   Mancel Bale, MD  promethazine (PHENERGAN) 12.5 MG tablet Take 1 tablet (12.5 mg total) by mouth every 6 (six) hours as needed for nausea or vomiting. Patient not taking: Reported on 04/22/2019 02/09/19   Petrucelli,  Lelon Mast R, PA-C    Allergies    Peanut-containing drug products, Penicillins, Aspirin, Gabapentin, Ibuprofen, Lisinopril, Morphine and related, Nsaids, Shellfish allergy, Tramadol, Trazodone, Fentanyl, and Morphine  Review of Systems   Review of Systems  All other systems reviewed and are negative.   Physical Exam Updated Vital Signs BP (!) 141/79 (BP Location: Right Arm)   Pulse 74   Temp 98 F (36.7 C) (Oral)   Resp 16   Ht 4\' 11"  (1.499 m)   Wt 89.4 kg   SpO2 99%   BMI 39.79 kg/m   Physical Exam Constitutional:      Appearance: Normal appearance.  Musculoskeletal:     Comments: Right knee: moderate effusion noted on superior aspect of patella, no ecchymosis, no warmth, tenderness to touch on medial, anterior, and lateral aspect of knee. She is able to move knee with pain.  Exam limited due to pain.  Right hip: Nontender to touch. She is able to move hip in all directions, but movement hurts her knee.   Skin:    General: Skin is warm.     Capillary Refill: Capillary refill takes less than 2 seconds.     Findings: No bruising, erythema or rash.  Neurological:     Mental Status: She is alert.  Psychiatric:        Mood and Affect: Mood normal.        Behavior: Behavior normal.        Thought Content: Thought content normal.     ED Results / Procedures / Treatments   Labs (all labs ordered are listed, but only abnormal results are displayed) Labs Reviewed - No data to display  EKG None  Radiology No results found.  Procedures Procedures (including critical care time)  Medications Ordered in ED Medications  oxyCODONE-acetaminophen (PERCOCET/ROXICET) 5-325 MG per tablet 1 tablet (has no administration in time range)    ED Course  I have reviewed the triage vital signs and the nursing notes.  Pertinent labs & imaging results that were available during my care of the patient were reviewed by me and considered in my medical decision making (see chart for  details).    MDM Rules/Calculators/A&P                      Pt is a 54 year old female with right knee pain for three days. Knee joint with no erythema, no warmth, normal pulses. No suspicion for septic joint. Knee Xray shows osteoarthritis and a small reactive joint effusion. She was given Percocet for pain. Pt was given crutches in clinic and she tolerated Percocet well. Gave her ED return precautions.  She will see Dr. Jordan Likes for her knee pain tomorrow.    Final Clinical Impression(s) / ED Diagnoses Final diagnoses:  Acute pain of right knee    Rx / DC Orders ED Discharge Orders    None       Farrel Gordon, PA-C 10/04/19 2345    Maia Plan, MD 10/05/19 2310

## 2019-10-11 ENCOUNTER — Encounter: Payer: Self-pay | Admitting: Family Medicine

## 2019-10-11 ENCOUNTER — Ambulatory Visit (INDEPENDENT_AMBULATORY_CARE_PROVIDER_SITE_OTHER): Payer: Medicare HMO | Admitting: Family Medicine

## 2019-10-11 ENCOUNTER — Other Ambulatory Visit: Payer: Self-pay

## 2019-10-11 DIAGNOSIS — M1711 Unilateral primary osteoarthritis, right knee: Secondary | ICD-10-CM

## 2019-10-11 MED ORDER — HYDROCODONE-ACETAMINOPHEN 5-325 MG PO TABS: 1.0000 | ORAL_TABLET | Freq: Three times a day (TID) | ORAL | 0 refills | Status: DC | PRN

## 2019-10-11 NOTE — Progress Notes (Signed)
Gina Cummings - 54 y.o. female MRN [id number]  Date of birth: [birth year]  SUBJECTIVE:  Including CC & ROS.  Chief Complaint  Patient presents with  . Knee Pain    right knee    Gina Cummings is a 54 y.o. female that is presenting with right knee pain.  The pain is acute on chronic in nature.  She has experienced this pain previously.  The pain is fairly severe in nature.  Denies any inciting event.  It will give way from time to time.  No history of surgery.  Independent review of the right knee x-ray from 3/30 shows moderate medial joint space narrowing.   Review of Systems See HPI   HISTORY: Past Medical, Surgical, Social, and Family History Reviewed & Updated per EMR.   Pertinent Historical Findings include:  Past Medical History:  Diagnosis Date  . Asthma   . Chronic back pain   . Chronic, continuous use of opioids   . Drug-seeking behavior   . Hypertension   . Stroke [facility])     Past Surgical History:  Procedure Laterality Date  . ABDOMINAL HYSTERECTOMY    . CHOLECYSTECTOMY    . KNEE SURGERY      Family History  Problem Relation Age of Onset  . Cancer Mother   . CAD Father   . CAD Maternal Grandmother     Social History   Socioeconomic History  . Marital status: Divorced    Spouse name: Not on file  . Number of children: Not on file  . Years of education: Not on file  . Highest education level: Not on file  Occupational History  . Not on file  Tobacco Use  . Smoking status: Never Smoker  . Smokeless tobacco: Never Used  Substance and Sexual Activity  . Alcohol use: Never  . Drug use: Never  . Sexual activity: Not on file  Other Topics Concern  . Not on file  Social History Narrative  . Not on file   Social Determinants of Health   Financial Resource Strain:   . Difficulty of Paying Living Expenses:   Food Insecurity:   . Worried About Programme researcher, broadcasting/film/video in the Last Year:   . Barista in the Last Year:   Transportation  Needs:   . Freight forwarder (Medical):   Marland Kitchen Lack of Transportation (Non-Medical):   Physical Activity:   . Days of Exercise per Week:   . Minutes of Exercise per Session:   Stress:   . Feeling of Stress :   Social Connections:   . Frequency of Communication with Friends and Family:   . Frequency of Social Gatherings with Friends and Family:   . Attends Religious Services:   . Active Member of Clubs or Organizations:   . Attends Banker Meetings:   Marland Kitchen Marital Status:   Intimate Partner Violence:   . Fear of Current or Ex-Partner:   . Emotionally Abused:   Marland Kitchen Physically Abused:   . Sexually Abused:      PHYSICAL EXAM:  VS: BP (!) 176/98   Pulse 88   Ht 5' (1.524 m)   Wt 196 lb (88.9 kg)   BMI 38.28 kg/m  Physical Exam Gen: NAD, alert, cooperative with exam, well-appearing MSK:  Right knee: Effusion noted. Tenderness to palpation over the medial joint line. Normal flexion and extension. Normal strength resistance. Neurovascularly intact     ASSESSMENT & PLAN:   OA (osteoarthritis) of  knee Presenting with acute on chronic right knee pain.  Likely related to degenerative changes. -Hinged knee brace. -Counseled on home exercise therapy and supportive care. -Norco. -Could consider injection physical therapy.

## 2019-10-11 NOTE — Patient Instructions (Signed)
NIce to meet you Please try ice  Please try the brace  Please try the exercises  Please use the norco for severe pain   Please send me a message in MyChart with any questions or updates.  Please see me back in 4 weeks or sooner if needed.   --Dr. Jordan Likes

## 2019-10-12 DIAGNOSIS — M179 Osteoarthritis of knee, unspecified: Secondary | ICD-10-CM | POA: Insufficient documentation

## 2019-10-12 DIAGNOSIS — M1710 Unilateral primary osteoarthritis, unspecified knee: Secondary | ICD-10-CM | POA: Insufficient documentation

## 2019-10-12 NOTE — Assessment & Plan Note (Signed)
Presenting with acute on chronic right knee pain.  Likely related to degenerative changes. -Hinged knee brace. -Counseled on home exercise therapy and supportive care. -Norco. -Could consider injection physical therapy.

## 2019-10-14 ENCOUNTER — Emergency Department (HOSPITAL_BASED_OUTPATIENT_CLINIC_OR_DEPARTMENT_OTHER)
Admission: EM | Admit: 2019-10-14 | Discharge: 2019-10-14 | Disposition: A | Discharge status: Home / Self Care | Payer: Medicare HMO | Attending: Emergency Medicine | Admitting: Emergency Medicine

## 2019-10-14 ENCOUNTER — Emergency Department (HOSPITAL_BASED_OUTPATIENT_CLINIC_OR_DEPARTMENT_OTHER): Payer: Medicare HMO

## 2019-10-14 ENCOUNTER — Other Ambulatory Visit: Payer: Self-pay

## 2019-10-14 ENCOUNTER — Encounter (HOSPITAL_BASED_OUTPATIENT_CLINIC_OR_DEPARTMENT_OTHER): Payer: Self-pay

## 2019-10-14 DIAGNOSIS — I10 Essential (primary) hypertension: Secondary | ICD-10-CM | POA: Diagnosis not present

## 2019-10-14 DIAGNOSIS — J45909 Unspecified asthma, uncomplicated: Secondary | ICD-10-CM | POA: Diagnosis not present

## 2019-10-14 DIAGNOSIS — R1010 Upper abdominal pain, unspecified: Secondary | ICD-10-CM | POA: Insufficient documentation

## 2019-10-14 DIAGNOSIS — Z79899 Other long term (current) drug therapy: Secondary | ICD-10-CM | POA: Insufficient documentation

## 2019-10-14 DIAGNOSIS — K625 Hemorrhage of anus and rectum: Secondary | ICD-10-CM | POA: Diagnosis present

## 2019-10-14 LAB — URINALYSIS, ROUTINE W REFLEX MICROSCOPIC
Bilirubin Urine: NEGATIVE
Glucose, UA: NEGATIVE mg/dL
Ketones, ur: NEGATIVE mg/dL
Leukocytes,Ua: NEGATIVE
Nitrite: NEGATIVE
Protein, ur: NEGATIVE mg/dL
Specific Gravity, Urine: 1.015 (ref 1.005–1.030)
pH: 5.5 (ref 5.0–8.0)

## 2019-10-14 LAB — COMPREHENSIVE METABOLIC PANEL
ALT: 29 U/L (ref 0–44)
AST: 29 U/L (ref 15–41)
Albumin: 4.3 g/dL (ref 3.5–5.0)
Alkaline Phosphatase: 97 U/L (ref 38–126)
Anion gap: 11 (ref 5–15)
BUN: 13 mg/dL (ref 6–20)
CO2: 20 mmol/L — ABNORMAL LOW (ref 22–32)
Calcium: 9.2 mg/dL (ref 8.9–10.3)
Chloride: 104 mmol/L (ref 98–111)
Creatinine, Ser: 0.59 mg/dL (ref 0.44–1.00)
GFR calc Af Amer: >60 mL/min (ref >60)
GFR calc non Af Amer: >60 mL/min (ref >60)
Glucose, Bld: 91 mg/dL (ref 70–99)
Potassium: 4.3 mmol/L (ref 3.5–5.1)
Sodium: 135 mmol/L (ref 135–145)
Total Bilirubin: 0.4 mg/dL (ref 0.3–1.2)
Total Protein: 8.1 g/dL (ref 6.5–8.1)

## 2019-10-14 LAB — CBC WITH DIFFERENTIAL/PLATELET
Abs Immature Granulocytes: 0.01 10*3/uL (ref 0.00–0.07)
Basophils Absolute: 0 10*3/uL (ref 0.0–0.1)
Basophils Relative: 1 %
Eosinophils Absolute: 0.1 10*3/uL (ref 0.0–0.5)
Eosinophils Relative: 1 %
HCT: 41.5 % (ref 36.0–46.0)
Hemoglobin: 13.7 g/dL (ref 12.0–15.0)
Immature Granulocytes: 0 %
Lymphocytes Relative: 53 %
Lymphs Abs: 1.8 10*3/uL (ref 0.7–4.0)
MCH: 31.7 pg (ref 26.0–34.0)
MCHC: 33 g/dL (ref 30.0–36.0)
MCV: 96.1 fL (ref 80.0–100.0)
Monocytes Absolute: 0.3 10*3/uL (ref 0.1–1.0)
Monocytes Relative: 10 %
Neutro Abs: 1.2 10*3/uL — ABNORMAL LOW (ref 1.7–7.7)
Neutrophils Relative %: 35 %
Platelets: 362 10*3/uL (ref 150–400)
RBC: 4.32 MIL/uL (ref 3.87–5.11)
RDW: 13.4 % (ref 11.5–15.5)
WBC: 3.5 10*3/uL — ABNORMAL LOW (ref 4.0–10.5)
nRBC: 0 % (ref 0.0–0.2)

## 2019-10-14 LAB — OCCULT BLOOD X 1 CARD TO LAB, STOOL: Fecal Occult Bld: POSITIVE — AB

## 2019-10-14 LAB — URINALYSIS, MICROSCOPIC (REFLEX): WBC, UA: NONE SEEN WBC/hpf (ref 0–5)

## 2019-10-14 LAB — LIPASE, BLOOD: Lipase: 27 U/L (ref 11–51)

## 2019-10-14 MED ORDER — HYDROMORPHONE HCL 1 MG/ML IJ SOLN
0.5000 mg | Freq: Once | INTRAMUSCULAR | Status: AC
  Administered 2019-10-14: 0.5 mg via INTRAVENOUS
  Filled 2019-10-14: qty 1

## 2019-10-14 MED ORDER — LIDOCAINE 4 % EX CREA: 1.0000 "application " | TOPICAL_CREAM | Freq: Three times a day (TID) | CUTANEOUS | 0 refills | Status: DC | PRN

## 2019-10-14 MED ORDER — FAMOTIDINE 20 MG PO TABS: 20.0000 mg | ORAL_TABLET | Freq: Two times a day (BID) | ORAL | 0 refills | Status: AC

## 2019-10-14 MED ORDER — ONDANSETRON HCL 4 MG/2ML IJ SOLN
4.0000 mg | Freq: Once | INTRAMUSCULAR | Status: AC
  Administered 2019-10-14: 4 mg via INTRAVENOUS
  Filled 2019-10-14: qty 2

## 2019-10-14 MED ORDER — ONDANSETRON 4 MG PO TBDP: 4.0000 mg | ORAL_TABLET | Freq: Three times a day (TID) | ORAL | 0 refills | Status: DC | PRN

## 2019-10-14 MED ORDER — IOHEXOL 300 MG/ML  SOLN
100.0000 mL | Freq: Once | INTRAMUSCULAR | Status: AC | PRN
  Administered 2019-10-14: 100 mL via INTRAVENOUS

## 2019-10-14 MED ORDER — HYDROMORPHONE HCL 1 MG/ML IJ SOLN: 1.0000 mg | Freq: Once | INTRAMUSCULAR | Status: DC

## 2019-10-14 NOTE — ED Triage Notes (Signed)
Pt c/o rectal bleeding and abd pain x 1 day

## 2019-10-14 NOTE — Discharge Instructions (Addendum)
1. Medications: Take Zofran as needed for nausea.  Wait around 20 minutes before eating or drinking after taking this medication.  Take famotidine twice daily for abdominal pain.  Apply lidocaine cream to areas of pain and massage gently. 2. Treatment: rest, drink plenty of fluids, advance diet slowly.  Eat a diet of bland foods that will not upset your stomach. 3. Follow Up: Please followup with your primary doctor in 3 days for discussion of your diagnoses and further evaluation after today's visit; if you do not have a primary care doctor use the resource guide provided to find one; Please return to the ER for persistent vomiting, high fevers or worsening symptoms

## 2019-10-14 NOTE — ED Provider Notes (Signed)
[facility]ENC[facility]PARTMENT Provider Note   CSN: [id number] Arrival date & time: 10/14/19  1830     History Chief Complaint  Patient presents with  . Rectal Bleeding    Gina Cummings is a 54 y.o. female with history of asthma, CVA, hypertension, chronic back pain presenting for evaluation of acute onset, constant right upper quadrant abdominal pain for 3 days.  Pain is throbbing, cramping, radiates to the right side of the back.  It worsens with certain movements.  She reports lifting her grandson but otherwise no heavy lifting or injuries.  She notes nausea but no vomiting.  She denies fever, chills, chest pain or shortness of breath but tells me that she feels anxious sometimes.  She has been having watery stools for the last 3 days and today when she wiped she noted bright red blood on the tissue paper.  Denies dysuria, hematuria, urinary urgency or frequency.  She is status post cholecystectomy and hysterectomy.  She has tried taking Tylenol without relief of symptoms.  The history is provided by the patient.       Past Medical History:  Diagnosis Date  . Asthma   . Chronic back pain   . Chronic, continuous use of opioids   . Drug-seeking behavior   . Hypertension   . Stroke [facility])     Patient Active Problem List   Diagnosis Date Noted  . OA (osteoarthritis) of knee 10/12/2019    Past Surgical History:  Procedure Laterality Date  . ABDOMINAL HYSTERECTOMY    . CHOLECYSTECTOMY    . KNEE SURGERY       OB History   No obstetric history on file.     Family History  Problem Relation Age of Onset  . Cancer Mother   . CAD Father   . CAD Maternal Grandmother     Social History   Tobacco Use  . Smoking status: Never Smoker  . Smokeless tobacco: Never Used  Substance Use Topics  . Alcohol use: Never  . Drug use: Never    Home Medications Prior to Admission medications   Medication Sig Start Date End Date Taking? Authorizing Provider    amLODipine (NORVASC) 10 MG tablet Take 0.5 tablets (5 mg total) by mouth daily. 03/13/19   Mancel Bale, MD  benzonatate (TESSALON) 100 MG capsule Take 1 capsule (100 mg total) by mouth 3 (three) times daily as needed for cough. Patient not taking: Reported on 04/22/2019 04/15/19   Michela Pitcher A, PA-C  cloNIDine (CATAPRES) 0.1 MG tablet Take 1 tablet (0.1 mg total) by mouth 2 (two) times daily. 02/14/19   Ward, Layla Maw, DO  diclofenac Sodium (VOLTAREN) 1 % GEL Apply 4 g topically 4 (four) times daily. 10/15/19   Gailen Shelter, PA  dicyclomine (BENTYL) 20 MG tablet Take 1 tablet (20 mg total) by mouth 2 (two) times daily. Patient not taking: Reported on 07/17/2019 05/18/19   Caccavale, Sophia, PA-C  famotidine (PEPCID) 20 MG tablet Take 1 tablet (20 mg total) by mouth 2 (two) times daily. 10/14/19   Hoy Fallert A, PA-C  hydrochlorothiazide (HYDRODIURIL) 25 MG tablet Take 1 tablet (25 mg total) by mouth daily. Patient not taking: Reported on 05/18/2019 02/14/19   Ward, Layla Maw, DO  HYDROcodone-acetaminophen (NORCO/VICODIN) 5-325 MG tablet Take 1 tablet by mouth every 8 (eight) hours as needed. 10/11/19   Myra Rude, MD  lidocaine (LMX) 4 % cream Apply 1 application topically 3 (three) times daily as needed.  10/14/19   Jasmene Goswami A, PA-C  ondansetron (ZOFRAN ODT) 4 MG disintegrating tablet Take 1 tablet (4 mg total) by mouth every 8 (eight) hours as needed for nausea or vomiting. 10/14/19   Reese Stockman A, PA-C  ondansetron (ZOFRAN) 4 MG tablet Take 1 tablet (4 mg total) by mouth every 8 (eight) hours as needed for nausea or vomiting. Patient not taking: Reported on 07/17/2019 04/15/19   Michela Pitcher A, PA-C  oxyCODONE (ROXICODONE) 15 MG immediate release tablet Take 1 tablet (15 mg total) by mouth every 4 (four) hours as needed. Patient taking differently: Take 15 mg by mouth every 4 (four) hours as needed for pain.  03/15/19   Melene Plan, DO  oxyCODONE-acetaminophen (PERCOCET/ROXICET) 5-325 MG tablet  Take 1 tablet by mouth every 6 (six) hours as needed for severe pain. 10/04/19   Long, Arlyss Repress, MD  predniSONE (DELTASONE) 20 MG tablet Take 1 tablet (20 mg total) by mouth 2 (two) times daily. Patient not taking: Reported on 04/22/2019 03/13/19   Mancel Bale, MD  promethazine (PHENERGAN) 12.5 MG tablet Take 1 tablet (12.5 mg total) by mouth every 6 (six) hours as needed for nausea or vomiting. Patient not taking: Reported on 04/22/2019 02/09/19   Petrucelli, Samantha R, PA-C  senna-docusate (SENOKOT-S) 8.6-50 MG tablet Take 1 tablet by mouth at bedtime as needed for mild constipation or moderate constipation. 10/04/19   Long, Arlyss Repress, MD    Allergies    Peanut-containing drug products, Penicillins, Aspirin, Gabapentin, Ibuprofen, Lisinopril, Morphine and related, Nsaids, Shellfish allergy, Tramadol, Trazodone, Fentanyl, and Morphine  Review of Systems   Review of Systems  Constitutional: Negative for chills and fever.  Respiratory: Negative for shortness of breath.   Cardiovascular: Negative for chest pain.  Gastrointestinal: Positive for abdominal pain, blood in stool, diarrhea and nausea. Negative for vomiting.  Genitourinary: Negative for dysuria, frequency, hematuria and urgency.  All other systems reviewed and are negative.   Physical Exam Updated Vital Signs BP (!) 145/81   Pulse 71   Temp 98.3 F (36.8 C)   Resp 20   Ht 5\' 4"  (1.626 m)   Wt 88 kg   SpO2 99%   BMI 33.30 kg/m   Physical Exam Vitals and nursing note reviewed.  Constitutional:      General: She is not in acute distress.    Appearance: She is well-developed. She is obese.  HENT:     Head: Normocephalic and atraumatic.  Eyes:     General:        Right eye: No discharge.        Left eye: No discharge.     Conjunctiva/sclera: Conjunctivae normal.  Neck:     Vascular: No JVD.     Trachea: No tracheal deviation.  Cardiovascular:     Rate and Rhythm: Normal rate.  Pulmonary:     Effort: Pulmonary  effort is normal.  Abdominal:     General: Bowel sounds are normal. There is no distension.     Palpations: Abdomen is soft.     Tenderness: There is abdominal tenderness in the right upper quadrant and epigastric area. There is right CVA tenderness. There is no left CVA tenderness, guarding or rebound. Negative signs include Murphy's sign and Rovsing's sign.  Skin:    Findings: No erythema.  Neurological:     Mental Status: She is alert.  Psychiatric:        Behavior: Behavior normal.     ED Results / Procedures /  Treatments   Labs (all labs ordered are listed, but only abnormal results are displayed) Labs Reviewed  CBC WITH DIFFERENTIAL/PLATELET - Abnormal; Notable for the following components:      Result Value   WBC 3.5 (*)    Neutro Abs 1.2 (*)    All other components within normal limits  COMPREHENSIVE METABOLIC PANEL - Abnormal; Notable for the following components:   CO2 20 (*)    All other components within normal limits  URINALYSIS, ROUTINE W REFLEX MICROSCOPIC - Abnormal; Notable for the following components:   Hgb urine dipstick TRACE (*)    All other components within normal limits  URINALYSIS, MICROSCOPIC (REFLEX) - Abnormal; Notable for the following components:   Bacteria, UA RARE (*)    All other components within normal limits  OCCULT BLOOD X 1 CARD TO LAB, STOOL - Abnormal; Notable for the following components:   Fecal Occult Bld POSITIVE (*)    All other components within normal limits  LIPASE, BLOOD    EKG None  Radiology  CT ABDOMEN PELVIS W CONTRAST  Result Date: 10/14/2019 CLINICAL DATA:  Right upper quadrant pain. EXAM: CT ABDOMEN AND PELVIS WITH CONTRAST TECHNIQUE: Multidetector CT imaging of the abdomen and pelvis was performed using the standard protocol following bolus administration of intravenous contrast. CONTRAST:  OMNIPAQUE IOHEXOL 300 MG/ML  SOLN COMPARISON:  May 18, 2019 FINDINGS: Lower chest: No acute abnormality.  Hepatobiliary: A stable 1.8 cm cyst is seen within the anterior aspect of the right lobe of the liver. Status post cholecystectomy. There is stable common bile duct dilatation. Pancreas: Unremarkable. No pancreatic ductal dilatation or surrounding inflammatory changes. Spleen: Normal in size without focal abnormality. Adrenals/Urinary Tract: Adrenal glands are unremarkable. Kidneys are normal, without renal calculi, focal lesion, or hydronephrosis. The urinary bladder is empty and subsequently limited in evaluation. Stomach/Bowel: There is a small hiatal hernia. The appendix is not clearly identified. No evidence of bowel wall thickening, distention, or inflammatory changes. Vascular/Lymphatic: Mild aortic calcification. No enlarged abdominal or pelvic lymph nodes. Reproductive: Status post hysterectomy. No adnexal masses. Other: No abdominal wall hernia or abnormality. No abdominopelvic ascites. Musculoskeletal: No acute or significant osseous findings. IMPRESSION: 1. Stable hepatic cyst. 2. Status post cholecystectomy. 3. Small hiatal hernia. 4. Aortic atherosclerosis. Aortic Atherosclerosis (ICD10-I70.0). Electronically Signed   By: Aram Candela M.D.   On: 10/14/2019 22:00    Procedures Procedures (including critical care time)  Medications Ordered in ED Medications  ondansetron [facility]) injection 4 mg (4 mg Intravenous Given 10/14/19 2136)  HYDROmorphone (DILAUDID) injection 0.5 mg (0.5 mg Intravenous Given 10/14/19 2137)  iohexol (OMNIPAQUE) 300 MG/ML solution 100 mL (100 mLs Intravenous Contrast Given 10/14/19 2140)  HYDROmorphone (DILAUDID) injection 0.5 mg (0.5 mg Intravenous Given 10/14/19 2306)    ED Course  I have reviewed the triage vital signs and the nursing notes.  Pertinent labs & imaging results that were available during my care of the patient were reviewed by me and considered in my medical decision making (see chart for details).    MDM Rules/Calculators/A&P                        Patient presenting for evaluation of right upper abdominal pains with associated nausea and loose stools.  She is status post right cholecystectomy and abdominal hysterectomy, does not have any lower abdominal tenderness on examination.  She is afebrile, vital signs are at her baseline.  She did note some blood in her  stools but states this is not unusual for her with her history of hemorrhoids.  She is nontoxic in appearance.  No rebound or guarding noted on examination.  Lab work reviewed by me shows chronic leukopenia, no anemia, no metabolic derangements, no renal insufficiency.  UA does not suggest UTI or nephrolithiasis.  Rectal examination shows no frank rectal bleeding, she does have external hemorrhoids and a small amount of bright blood on digital rectal exam.  However with stable hemoglobin and reassuring vital signs I do not feel that she requires emergent scope.  BUN is within normal limits and she has had no complaint of hematemesis, no concern for upper GI bleed.  Imaging obtained shows no evidence of acute surgical abdominal pathology.  Her pain was managed in the ED and on reevaluation she is resting comfortably in no apparent distress.  She is tolerating p.o. food and fluids in the ED without difficulty and serial abdominal examinations remain benign.  Recommend follow-up with PCP for reevaluation of symptoms.  Discussed strict ED return precautions. Patient verbalized understanding of and agreement with plan and is safe for discharge home at this time.    Final Clinical Impression(s) / ED Diagnoses Final diagnoses:  Upper abdominal pain    Rx / DC Orders ED Discharge Orders         Ordered    famotidine (PEPCID) 20 MG tablet  2 times daily     10/14/19 2304    ondansetron (ZOFRAN ODT) 4 MG disintegrating tablet  Every 8 hours PRN     10/14/19 2304    lidocaine (LMX) 4 % cream  3 times daily PRN,   Status:  Discontinued     10/14/19 2304    lidocaine (LMX) 4 % cream  3 times  daily PRN     10/14/19 2307           Renita Papa, PA-C 10/18/19 2321    Margette Fast, MD 10/19/19 1351

## 2019-10-15 ENCOUNTER — Telehealth (HOSPITAL_BASED_OUTPATIENT_CLINIC_OR_DEPARTMENT_OTHER): Payer: Self-pay | Admitting: Emergency Medicine

## 2019-10-15 MED ORDER — DICLOFENAC SODIUM 1 % EX GEL: 4.0000 g | Freq: Four times a day (QID) | CUTANEOUS | 0 refills | Status: DC

## 2019-10-15 NOTE — Telephone Encounter (Signed)
Patient was unable to obtain topical lidocaine cream as her insurance is in favor and is expensive.  I discussed the case with Dr. Adela Lank and we decided to use diclofenac gel as it has a low level of systemic absorption.  She has a GI bleed therefore oral NSAIDs should be avoided.  This medication was sent to the pharmacy for her.

## 2019-10-27 ENCOUNTER — Telehealth: Payer: Self-pay | Admitting: Family Medicine

## 2019-10-27 NOTE — Telephone Encounter (Signed)
Patient is still experiencing knee pain. She is requesting medication to help with pain.   Pharmacy: [facility] on [address]

## 2019-10-28 MED ORDER — PREDNISONE 5 MG PO TABS: ORAL_TABLET | ORAL | 0 refills | Status: DC

## 2019-10-28 NOTE — Telephone Encounter (Signed)
Provided prednisone for pain.   Myra Rude, MD [facility] 10/28/2019, 11:10 AM

## 2019-10-31 ENCOUNTER — Encounter: Payer: Self-pay | Admitting: Family Medicine

## 2019-10-31 ENCOUNTER — Other Ambulatory Visit: Payer: Self-pay

## 2019-10-31 ENCOUNTER — Encounter (HOSPITAL_BASED_OUTPATIENT_CLINIC_OR_DEPARTMENT_OTHER): Payer: Self-pay | Admitting: Emergency Medicine

## 2019-10-31 ENCOUNTER — Emergency Department (HOSPITAL_BASED_OUTPATIENT_CLINIC_OR_DEPARTMENT_OTHER)
Admission: EM | Admit: 2019-10-31 | Discharge: 2019-10-31 | Disposition: A | Discharge status: Home / Self Care | Payer: Medicare HMO | Attending: Emergency Medicine | Admitting: Emergency Medicine

## 2019-10-31 ENCOUNTER — Ambulatory Visit (INDEPENDENT_AMBULATORY_CARE_PROVIDER_SITE_OTHER): Payer: Medicare HMO | Admitting: Family Medicine

## 2019-10-31 DIAGNOSIS — G8929 Other chronic pain: Secondary | ICD-10-CM | POA: Diagnosis not present

## 2019-10-31 DIAGNOSIS — M5441 Lumbago with sciatica, right side: Secondary | ICD-10-CM | POA: Diagnosis not present

## 2019-10-31 DIAGNOSIS — Z79899 Other long term (current) drug therapy: Secondary | ICD-10-CM | POA: Insufficient documentation

## 2019-10-31 DIAGNOSIS — M545 Low back pain: Secondary | ICD-10-CM | POA: Diagnosis present

## 2019-10-31 DIAGNOSIS — Z880 Allergy status to penicillin: Secondary | ICD-10-CM | POA: Insufficient documentation

## 2019-10-31 DIAGNOSIS — I10 Essential (primary) hypertension: Secondary | ICD-10-CM | POA: Insufficient documentation

## 2019-10-31 DIAGNOSIS — M1711 Unilateral primary osteoarthritis, right knee: Secondary | ICD-10-CM | POA: Diagnosis not present

## 2019-10-31 DIAGNOSIS — Z91010 Allergy to peanuts: Secondary | ICD-10-CM | POA: Insufficient documentation

## 2019-10-31 DIAGNOSIS — Z888 Allergy status to other drugs, medicaments and biological substances status: Secondary | ICD-10-CM | POA: Insufficient documentation

## 2019-10-31 DIAGNOSIS — Z8673 Personal history of transient ischemic attack (TIA), and cerebral infarction without residual deficits: Secondary | ICD-10-CM | POA: Insufficient documentation

## 2019-10-31 DIAGNOSIS — Z91013 Allergy to seafood: Secondary | ICD-10-CM | POA: Diagnosis not present

## 2019-10-31 DIAGNOSIS — M5442 Lumbago with sciatica, left side: Secondary | ICD-10-CM | POA: Diagnosis not present

## 2019-10-31 DIAGNOSIS — Z885 Allergy status to narcotic agent status: Secondary | ICD-10-CM | POA: Diagnosis not present

## 2019-10-31 MED ORDER — KETOROLAC TROMETHAMINE 15 MG/ML IJ SOLN
15.0000 mg | Freq: Once | INTRAMUSCULAR | Status: AC
  Administered 2019-10-31: 07:00:00 15 mg via INTRAMUSCULAR
  Filled 2019-10-31: qty 1

## 2019-10-31 MED ORDER — OXYCODONE-ACETAMINOPHEN 5-325 MG PO TABS
1.0000 | ORAL_TABLET | Freq: Once | ORAL | Status: AC
  Administered 2019-10-31: 1 via ORAL
  Filled 2019-10-31: qty 1

## 2019-10-31 MED ORDER — METHYLPREDNISOLONE 4 MG PO TBPK: ORAL_TABLET | ORAL | 0 refills | Status: DC

## 2019-10-31 MED ORDER — HYDROCODONE-ACETAMINOPHEN 5-325 MG PO TABS: 1.0000 | ORAL_TABLET | Freq: Three times a day (TID) | ORAL | 0 refills | Status: DC | PRN

## 2019-10-31 NOTE — Patient Instructions (Signed)
Good to see you Please try ice  Please only use the norco for severe pain  Please send me a message in MyChart with any questions or updates.  Please follow up to try an injection if the pain isn't improved in a week.   --Dr. Jordan Likes

## 2019-10-31 NOTE — Discharge Instructions (Addendum)
You were seen today for ongoing worsening bilateral leg pain and back pain.  This is likely related to your known sciatica.  Take your pain medications at home as prescribed.  You will be given a Medrol dose pack for inflammation.  If you develop weakness, numbness, difficulty peeing or pooping, you should be reevaluated.

## 2019-10-31 NOTE — ED Notes (Signed)
ED Provider at bedside. 

## 2019-10-31 NOTE — Progress Notes (Signed)
Gina Cummings - 53 y.o. female MRN [id number]  Date of birth: Dec 28, 1965  SUBJECTIVE:  Including CC & ROS.  Chief Complaint  Patient presents with  . Knee Pain    right knee    Gina Cummings is a 54 y.o. female that is presenting with acute exacerbation of the right knee/leg pain. She was seen in the emergency department earlier this morning. She was provided Toradol as well as 1 pill of Percocet. She reports the pain is in the right leg at the knee and below. She does have a history of radicular symptoms and has previously had epidurals as well as facet injections and radiofrequency ablations.   Review of Systems See HPI   HISTORY: Past Medical, Surgical, Social, and Family History Reviewed & Updated per EMR.   Pertinent Historical Findings include:  Past Medical History:  Diagnosis Date  . Asthma   . Chronic back pain   . Chronic, continuous use of opioids   . Drug-seeking behavior   . Hypertension   . Stroke [facility])     Past Surgical History:  Procedure Laterality Date  . ABDOMINAL HYSTERECTOMY    . CHOLECYSTECTOMY    . KNEE SURGERY      Family History  Problem Relation Age of Onset  . Cancer Mother   . CAD Father   . CAD Maternal Grandmother     Social History   Socioeconomic History  . Marital status: Divorced    Spouse name: Not on file  . Number of children: Not on file  . Years of education: Not on file  . Highest education level: Not on file  Occupational History  . Not on file  Tobacco Use  . Smoking status: Never Smoker  . Smokeless tobacco: Never Used  Substance and Sexual Activity  . Alcohol use: Never  . Drug use: Never  . Sexual activity: Not on file  Other Topics Concern  . Not on file  Social History Narrative  . Not on file   Social Determinants of Health   Financial Resource Strain:   . Difficulty of Paying Living Expenses:   Food Insecurity:   . Worried About Charity fundraiser in the Last Year:   . Arboriculturist in  the Last Year:   Transportation Needs:   . Film/video editor (Medical):   Marland Kitchen Lack of Transportation (Non-Medical):   Physical Activity:   . Days of Exercise per Week:   . Minutes of Exercise per Session:   Stress:   . Feeling of Stress :   Social Connections:   . Frequency of Communication with Friends and Family:   . Frequency of Social Gatherings with Friends and Family:   . Attends Religious Services:   . Active Member of Clubs or Organizations:   . Attends Archivist Meetings:   Marland Kitchen Marital Status:   Intimate Partner Violence:   . Fear of Current or Ex-Partner:   . Emotionally Abused:   Marland Kitchen Physically Abused:   . Sexually Abused:      PHYSICAL EXAM:  VS: BP (!) 160/86   Pulse 84   Ht 4\' 11"  (1.499 m)   Wt 199 lb (90.3 kg)   BMI 40.19 kg/m  Physical Exam Gen: NAD, alert, cooperative with exam, well-appearing MSK:  Right knee: No obvious effusion. Tenderness to palpation over the medial joint line. Instability with valgus varus stress testing. Neurovascularly intact     ASSESSMENT & PLAN:   OA (  osteoarthritis) of knee Acute worsening of the pain. She is unable to take prednisone as it makes her nauseous. She was seen in the emergency department earlier this morning. Her symptoms may be radicular in nature. She does have a history of facet injections and radiofrequency ablation. -Counseled on home exercise therapy and supportive care. -Provided Norco and counseled on its use. -Could consider injection. If no improvement with injection would consider MRI of the lumbar spine for epidurals.

## 2019-10-31 NOTE — ED Triage Notes (Signed)
Pt reports sciatica x1 year. States pain radiates to both legs. States "i've got bruises on the inside of my legs"

## 2019-10-31 NOTE — Assessment & Plan Note (Signed)
Acute worsening of the pain. She is unable to take prednisone as it makes her nauseous. She was seen in the emergency department earlier this morning. Her symptoms may be radicular in nature. She does have a history of facet injections and radiofrequency ablation. -Counseled on home exercise therapy and supportive care. -Provided Norco and counseled on its use. -Could consider injection. If no improvement with injection would consider MRI of the lumbar spine for epidurals.

## 2019-10-31 NOTE — ED Provider Notes (Signed)
[facility] EMERGENCY DEPARTMENT Provider Note   CSN: [id number] Arrival date & time: 10/31/19  7253     History Chief Complaint  Patient presents with  . Back Pain    sciatica radiating to both legs     Gina Cummings is a 54 y.o. female.  HPI     This a 54 year old female with a history of chronic back pain and sciatica, hypertension who presents with worsening pain.  Patient reports over the last 2 to 3 days she has had worsening back pain that radiates into both legs.  She has had issues with this in the past over the last year.  She took an oxycodone at home with no relief.  She reports that her pain is 10 out of 10.  It is worse with ambulating.  Denies weakness, numbness, tingling of the lower extremities, bowel or bladder difficulty.  Patient also reports that she has noted bruising in bilateral thighs.  She denies injury.  No history of blood clots or coagulopathy.    Past Medical History:  Diagnosis Date  . Asthma   . Chronic back pain   . Chronic, continuous use of opioids   . Drug-seeking behavior   . Hypertension   . Stroke [facility])     Patient Active Problem List   Diagnosis Date Noted  . OA (osteoarthritis) of knee 10/12/2019    Past Surgical History:  Procedure Laterality Date  . ABDOMINAL HYSTERECTOMY    . CHOLECYSTECTOMY    . KNEE SURGERY       OB History   No obstetric history on file.     Family History  Problem Relation Age of Onset  . Cancer Mother   . CAD Father   . CAD Maternal Grandmother     Social History   Tobacco Use  . Smoking status: Never Smoker  . Smokeless tobacco: Never Used  Substance Use Topics  . Alcohol use: Never  . Drug use: Never    Home Medications Prior to Admission medications   Medication Sig Start Date End Date Taking? Authorizing Provider  amLODipine (NORVASC) 10 MG tablet Take 0.5 tablets (5 mg total) by mouth daily. 03/13/19   Mancel Bale, MD  benzonatate (TESSALON) 100 MG capsule Take  1 capsule (100 mg total) by mouth 3 (three) times daily as needed for cough. Patient not taking: Reported on 04/22/2019 04/15/19   Michela Pitcher A, PA-C  cloNIDine (CATAPRES) 0.1 MG tablet Take 1 tablet (0.1 mg total) by mouth 2 (two) times daily. 02/14/19   Ward, Layla Maw, DO  diclofenac Sodium (VOLTAREN) 1 % GEL Apply 4 g topically 4 (four) times daily. 10/15/19   Gailen Shelter, PA  dicyclomine (BENTYL) 20 MG tablet Take 1 tablet (20 mg total) by mouth 2 (two) times daily. Patient not taking: Reported on 07/17/2019 05/18/19   Caccavale, Sophia, PA-C  famotidine (PEPCID) 20 MG tablet Take 1 tablet (20 mg total) by mouth 2 (two) times daily. 10/14/19   Fawze, Mina A, PA-C  hydrochlorothiazide (HYDRODIURIL) 25 MG tablet Take 1 tablet (25 mg total) by mouth daily. Patient not taking: Reported on 05/18/2019 02/14/19   Ward, Layla Maw, DO  HYDROcodone-acetaminophen (NORCO/VICODIN) 5-325 MG tablet Take 1 tablet by mouth every 8 (eight) hours as needed. 10/31/19   Myra Rude, MD  lidocaine (LMX) 4 % cream Apply 1 application topically 3 (three) times daily as needed. 10/14/19   Fawze, Mina A, PA-C  methylPREDNISolone (MEDROL DOSEPAK) 4 MG  TBPK tablet Take as directed on packet 10/31/19   Kamilo Och, Barbette Hair, MD  ondansetron (ZOFRAN ODT) 4 MG disintegrating tablet Take 1 tablet (4 mg total) by mouth every 8 (eight) hours as needed for nausea or vomiting. 10/14/19   Fawze, Mina A, PA-C  ondansetron (ZOFRAN) 4 MG tablet Take 1 tablet (4 mg total) by mouth every 8 (eight) hours as needed for nausea or vomiting. Patient not taking: Reported on 07/17/2019 04/15/19   Rodell Perna A, PA-C  oxyCODONE (ROXICODONE) 15 MG immediate release tablet Take 1 tablet (15 mg total) by mouth every 4 (four) hours as needed. Patient taking differently: Take 15 mg by mouth every 4 (four) hours as needed for pain.  03/15/19   Deno Etienne, DO  oxyCODONE-acetaminophen (PERCOCET/ROXICET) 5-325 MG tablet Take 1 tablet by mouth every 6 (six)  hours as needed for severe pain. 10/04/19   Long, Wonda Olds, MD  predniSONE (DELTASONE) 5 MG tablet Take 6 pills for first day, 5 pills second day, 4 pills third day, 3 pills fourth day, 2 pills the fifth day, and 1 pill sixth day. 10/28/19   Rosemarie Ax, MD  promethazine (PHENERGAN) 12.5 MG tablet Take 1 tablet (12.5 mg total) by mouth every 6 (six) hours as needed for nausea or vomiting. Patient not taking: Reported on 04/22/2019 02/09/19   Petrucelli, Samantha R, PA-C  senna-docusate (SENOKOT-S) 8.6-50 MG tablet Take 1 tablet by mouth at bedtime as needed for mild constipation or moderate constipation. 10/04/19   Long, Wonda Olds, MD    Allergies    Peanut-containing drug products, Penicillins, Aspirin, Gabapentin, Ibuprofen, Lisinopril, Morphine and related, Nsaids, Shellfish allergy, Tramadol, Trazodone, Fentanyl, and Morphine  Review of Systems   Review of Systems  Constitutional: Negative for fever.  Respiratory: Negative for shortness of breath.   Cardiovascular: Negative for chest pain.  Gastrointestinal: Negative for abdominal pain.  Genitourinary: Negative for difficulty urinating.  Musculoskeletal: Positive for back pain.  Skin: Positive for color change.  Neurological: Negative for weakness.  All other systems reviewed and are negative.   Physical Exam Updated Vital Signs BP (!) 162/83 (BP Location: Right Arm)   Pulse 79   Temp 98.1 F (36.7 C) (Oral)   Resp 16   Ht 1.499 m (4\' 11" )   Wt 90.3 kg   SpO2 100%   BMI 40.19 kg/m   Physical Exam Vitals and nursing note reviewed.  Constitutional:      Appearance: She is well-developed. She is obese. She is not ill-appearing.  HENT:     Head: Normocephalic and atraumatic.     Mouth/Throat:     Mouth: Mucous membranes are moist.  Eyes:     Pupils: Pupils are equal, round, and reactive to light.  Cardiovascular:     Rate and Rhythm: Normal rate and regular rhythm.     Heart sounds: Normal heart sounds.  Pulmonary:      Effort: Pulmonary effort is normal. No respiratory distress.  Abdominal:     General: Bowel sounds are normal.     Palpations: Abdomen is soft.     Tenderness: There is no abdominal tenderness.  Musculoskeletal:     Cervical back: Neck supple.     Comments: No midline lower lumbar tenderness palpation, step-off, deformity, positive straight leg raise  Skin:    General: Skin is warm and dry.     Comments: Small bruise noted over the distal aspect of the left medial thigh, no tenderness to palpation  Neurological:  Mental Status: She is alert and oriented to person, place, and time.     Comments: 5 out of 5 strength bilateral lower extremities, no clonus  Psychiatric:        Mood and Affect: Mood normal.     ED Results / Procedures / Treatments   Labs (all labs ordered are listed, but only abnormal results are displayed) Labs Reviewed - No data to display  EKG None  Radiology No results found.  Procedures Procedures (including critical care time)  Medications Ordered in ED Medications  ketorolac (TORADOL) 15 MG/ML injection 15 mg (15 mg Intramuscular Given 10/31/19 0642)  oxyCODONE-acetaminophen (PERCOCET/ROXICET) 5-325 MG per tablet 1 tablet (1 tablet Oral Given 10/31/19 0641)  oxyCODONE-acetaminophen (PERCOCET/ROXICET) 5-325 MG per tablet 1 tablet (1 tablet Oral Given 10/31/19 7322)    ED Course  I have reviewed the triage vital signs and the nursing notes.  Pertinent labs & imaging results that were available during my care of the patient were reviewed by me and considered in my medical decision making (see chart for details).    MDM Rules/Calculators/A&P                       Patient presents with back pain radiating bilateral lower extremities.  Reports a history of the same.  Worsening over the last 24 hours.  History of sciatica and she takes oxycodone.  She is overall nontoxic.  Vital signs notable for blood pressure 162/83.  She has no red flags of back  pain.  No fever or IV drug use.  No signs or symptoms of cauda equina.  Doubt lesion or fracture.  Do not feel she needs imaging at this time as she is neurologically intact.  Patient was given oral pain medication and Toradol.  Discussed with patient that she needs to add an anti-inflammatory such as a Medrol Dosepak to her pain regimen and follow-up with neurosurgery.  Patient states she has seen neurosurgery in an outside state previously but has not followed up here in [place].  After history, exam, and medical workup I feel the patient has been appropriately medically screened and is safe for discharge home. Pertinent diagnoses were discussed with the patient. Patient was given return precautions.   Final Clinical Impression(s) / ED Diagnoses Final diagnoses:  Chronic bilateral low back pain with bilateral sciatica    Rx / DC Orders ED Discharge Orders         Ordered    methylPREDNISolone (MEDROL DOSEPAK) 4 MG TBPK tablet     10/31/19 0738           Damichael Hofman, Mayer Masker, MD 11/01/19 [phone]

## 2019-11-01 ENCOUNTER — Encounter (HOSPITAL_COMMUNITY): Payer: Self-pay | Admitting: *Deleted

## 2019-11-01 ENCOUNTER — Other Ambulatory Visit: Payer: Self-pay

## 2019-11-01 ENCOUNTER — Emergency Department (HOSPITAL_COMMUNITY)
Admission: EM | Admit: 2019-11-01 | Discharge: 2019-11-02 | Disposition: A | Discharge status: Home / Self Care | Payer: Medicare HMO | Attending: Emergency Medicine | Admitting: Emergency Medicine

## 2019-11-01 ENCOUNTER — Ambulatory Visit: Payer: Self-pay | Admitting: *Deleted

## 2019-11-01 DIAGNOSIS — M549 Dorsalgia, unspecified: Secondary | ICD-10-CM | POA: Diagnosis present

## 2019-11-01 DIAGNOSIS — I10 Essential (primary) hypertension: Secondary | ICD-10-CM | POA: Insufficient documentation

## 2019-11-01 DIAGNOSIS — Z8673 Personal history of transient ischemic attack (TIA), and cerebral infarction without residual deficits: Secondary | ICD-10-CM | POA: Insufficient documentation

## 2019-11-01 DIAGNOSIS — M545 Low back pain: Secondary | ICD-10-CM | POA: Diagnosis not present

## 2019-11-01 DIAGNOSIS — Z79899 Other long term (current) drug therapy: Secondary | ICD-10-CM | POA: Insufficient documentation

## 2019-11-01 DIAGNOSIS — M5431 Sciatica, right side: Secondary | ICD-10-CM | POA: Diagnosis not present

## 2019-11-01 MED ORDER — HYDROMORPHONE HCL 2 MG/ML IJ SOLN
2.0000 mg | Freq: Once | INTRAMUSCULAR | Status: AC
  Administered 2019-11-02: 2 mg via INTRAMUSCULAR
  Filled 2019-11-01: qty 1

## 2019-11-01 MED ORDER — ONDANSETRON 8 MG PO TBDP
8.0000 mg | ORAL_TABLET | Freq: Once | ORAL | Status: AC
  Administered 2019-11-02: 8 mg via ORAL
  Filled 2019-11-01: qty 1

## 2019-11-01 NOTE — ED Provider Notes (Signed)
[facility]-EMERGENCY DEPT Provider Note   CSN: [id number] Arrival date & time: 11/01/19  1953     History Chief Complaint  Patient presents with  . Back Pain    Gina Cummings is a 54 y.o. female.  HPI She presents for evaluation of an achy sensation in her entire right leg, and low back pain.  She was seen yesterday morning in the ED, treated symptomatically and given a prescription for prednisone for the same pain.  She states she did not get the prescription filled because she cannot take prednisone based on experience using it, 10 years ago.  She reports having some surgery on her left knee previously but not her back.  She has had prior left sided sciatica but now has right-sided sciatica that has been present for about a year.  She has recently relocated to [facility] from [place].  When I asked her about her experience there she admitted that she has previously had problems with narcotic pain pills and has had trouble getting them from providers because of suspected misuse.  She states she has not been able to sleep for 3 days because of the current pain.  She does not have a known allergy to prednisone, but feels like she should not take it.  She denies bowel or bladder incontinence, fever, chills, nausea, vomiting, weakness or dizziness.  There are no other known modifying factors.    Past Medical History:  Diagnosis Date  . Asthma   . Chronic back pain   . Chronic, continuous use of opioids   . Drug-seeking behavior   . Hypertension   . Stroke [facility])     Patient Active Problem List   Diagnosis Date Noted  . OA (osteoarthritis) of knee 10/12/2019    Past Surgical History:  Procedure Laterality Date  . ABDOMINAL HYSTERECTOMY    . CHOLECYSTECTOMY    . KNEE SURGERY       OB History   No obstetric history on file.     Family History  Problem Relation Age of Onset  . Cancer Mother   . CAD Father   . CAD Maternal Grandmother       Social History   Tobacco Use  . Smoking status: Never Smoker  . Smokeless tobacco: Never Used  Substance Use Topics  . Alcohol use: Never  . Drug use: Never    Home Medications Prior to Admission medications   Medication Sig Start Date End Date Taking? Authorizing Provider  amLODipine (NORVASC) 10 MG tablet Take 0.5 tablets (5 mg total) by mouth daily. 03/13/19   Mancel Bale, MD  benzonatate (TESSALON) 100 MG capsule Take 1 capsule (100 mg total) by mouth 3 (three) times daily as needed for cough. Patient not taking: Reported on 04/22/2019 04/15/19   Michela Pitcher A, PA-C  cloNIDine (CATAPRES) 0.1 MG tablet Take 1 tablet (0.1 mg total) by mouth 2 (two) times daily. 02/14/19   Ward, Layla Maw, DO  diclofenac Sodium (VOLTAREN) 1 % GEL Apply 4 g topically 4 (four) times daily. 10/15/19   Gailen Shelter, PA  dicyclomine (BENTYL) 20 MG tablet Take 1 tablet (20 mg total) by mouth 2 (two) times daily. Patient not taking: Reported on 07/17/2019 05/18/19   Caccavale, Sophia, PA-C  famotidine (PEPCID) 20 MG tablet Take 1 tablet (20 mg total) by mouth 2 (two) times daily. 10/14/19   Fawze, Mina A, PA-C  hydrochlorothiazide (HYDRODIURIL) 25 MG tablet Take 1 tablet (25 mg total)  by mouth daily. Patient not taking: Reported on 05/18/2019 02/14/19   Ward, Delice Bison, DO  HYDROcodone-acetaminophen (NORCO/VICODIN) 5-325 MG tablet Take 1 tablet by mouth every 8 (eight) hours as needed. 10/31/19   Rosemarie Ax, MD  lidocaine (LMX) 4 % cream Apply 1 application topically 3 (three) times daily as needed. 10/14/19   Fawze, Mina A, PA-C  methylPREDNISolone (MEDROL DOSEPAK) 4 MG TBPK tablet Take as directed on packet 10/31/19   Horton, Barbette Hair, MD  ondansetron (ZOFRAN ODT) 4 MG disintegrating tablet Take 1 tablet (4 mg total) by mouth every 8 (eight) hours as needed for nausea or vomiting. 10/14/19   Fawze, Mina A, PA-C  ondansetron (ZOFRAN) 4 MG tablet Take 1 tablet (4 mg total) by mouth every 8 (eight) hours  as needed for nausea or vomiting. Patient not taking: Reported on 07/17/2019 04/15/19   Rodell Perna A, PA-C  oxyCODONE (ROXICODONE) 15 MG immediate release tablet Take 1 tablet (15 mg total) by mouth every 4 (four) hours as needed. Patient taking differently: Take 15 mg by mouth every 4 (four) hours as needed for pain.  03/15/19   Deno Etienne, DO  oxyCODONE-acetaminophen (PERCOCET/ROXICET) 5-325 MG tablet Take 1 tablet by mouth every 6 (six) hours as needed for severe pain. 10/04/19   Long, Wonda Olds, MD  predniSONE (DELTASONE) 5 MG tablet Take 6 pills for first day, 5 pills second day, 4 pills third day, 3 pills fourth day, 2 pills the fifth day, and 1 pill sixth day. 10/28/19   Rosemarie Ax, MD  promethazine (PHENERGAN) 12.5 MG tablet Take 1 tablet (12.5 mg total) by mouth every 6 (six) hours as needed for nausea or vomiting. Patient not taking: Reported on 04/22/2019 02/09/19   Petrucelli, Samantha R, PA-C  senna-docusate (SENOKOT-S) 8.6-50 MG tablet Take 1 tablet by mouth at bedtime as needed for mild constipation or moderate constipation. 10/04/19   Long, Wonda Olds, MD    Allergies    Peanut-containing drug products, Penicillins, Aspirin, Gabapentin, Ibuprofen, Lisinopril, Morphine and related, Nsaids, Shellfish allergy, Tramadol, Trazodone, Fentanyl, and Morphine  Review of Systems   Review of Systems  All other systems reviewed and are negative.   Physical Exam Updated Vital Signs BP (!) 214/98 (BP Location: Left Arm)   Pulse 66   Temp 98.5 F (36.9 C) (Oral)   Resp 16   SpO2 100%   Physical Exam Vitals and nursing note reviewed.  Constitutional:      General: She is in acute distress (Sitting in chair, rubbing her right thigh, and pressing firmly on the upper aspect medially.).     Appearance: She is well-developed. She is not ill-appearing, toxic-appearing or diaphoretic.  HENT:     Head: Normocephalic and atraumatic.  Eyes:     Conjunctiva/sclera: Conjunctivae normal.      Pupils: Pupils are equal, round, and reactive to light.  Neck:     Trachea: Phonation normal.  Cardiovascular:     Rate and Rhythm: Normal rate and regular rhythm.  Pulmonary:     Effort: Pulmonary effort is normal.     Breath sounds: Normal breath sounds.  Chest:     Chest wall: No tenderness.  Abdominal:     General: There is no distension.  Musculoskeletal:        General: Normal range of motion.     Cervical back: Normal range of motion and neck supple.     Comments: Mild diffuse low back tenderness to palpation.  Able to  move both legs, equally.  Skin:    General: Skin is warm and dry.  Neurological:     Mental Status: She is alert and oriented to person, place, and time.     Motor: No abnormal muscle tone.  Psychiatric:        Mood and Affect: Mood normal.        Behavior: Behavior normal.        Thought Content: Thought content normal.        Judgment: Judgment normal.     ED Results / Procedures / Treatments   Labs (all labs ordered are listed, but only abnormal results are displayed) Labs Reviewed - No data to display  EKG None  Radiology No results found.  Procedures Procedures (including critical care time)  Medications Ordered in ED Medications  HYDROmorphone (DILAUDID) injection 2 mg (2 mg Intramuscular Given 11/02/19 0014)  ondansetron (ZOFRAN-ODT) disintegrating tablet 8 mg (8 mg Oral Given 11/02/19 0014)    ED Course  I have reviewed the triage vital signs and the nursing notes.  Pertinent labs & imaging results that were available during my care of the patient were reviewed by me and considered in my medical decision making (see chart for details).    MDM Rules/Calculators/A&P                       Patient Vitals for the past 24 hrs:  BP Temp Temp src Pulse Resp SpO2  11/02/19 0110 (!) 214/98 -- -- 66 16 --  11/01/19 2000 (!) 166/105 98.5 F (36.9 C) Oral 78 18 100 %    At discharge- reevaluation with update and discussion. After  initial assessment and treatment, an updated evaluation reveals she is more comfortable and has no further complaints. Mancel Bale   Medical Decision Making:  This patient is presenting for evaluation of musculoskeletal pain, which does not require a range of treatment options, and he has not a complaint that involves a high risk of morbidity and mortality. The differential diagnoses include radiculopathy, muscle strain, arthritis. I decided  to review old records, and in summary patient with chronic complaints of pain, and prior documentation of drug-seeking behavior. I did not require additional historical information from anyone. Clinical Laboratory Tests Ordered, included none. Radiologic Tests Ordered, included none.  Critical Interventions-observation and medication treatment  After These Interventions, the Patient was reevaluated and was found stable for discharge with mild hypertension not currently being treated.  Doubt spinal myelopathy.  Doubt hypertensive urgency  CRITICAL CARE-no Performed by: Mancel Bale   Nursing Notes Reviewed/ Care Coordinated Applicable Imaging Reviewed Interpretation of Laboratory Data incorporated into ED treatment  The patient appears reasonably screened and/or stabilized for discharge and I doubt any other medical condition or other [facility] requiring further screening, evaluation, or treatment in the ED at this time prior to discharge.  Plan: Home Medications-resume usual home medicines including OTC as needed; Home Treatments-heat to affected area; return here if the recommended treatment, does not improve the symptoms; Recommended follow up-PCP, for checkup 1 to 2 weeks and as needed    Final Clinical Impression(s) / ED Diagnoses Final diagnoses:  Sciatica of right side  Hypertension, unspecified type    Rx / DC Orders ED Discharge Orders    None       Mancel Bale, MD 11/02/19 1308

## 2019-11-01 NOTE — Telephone Encounter (Addendum)
Seen in [facility] Emergency Room yesterday as well as Orthopedic office visit.  From Ortho office visit:   "This a 54 year old female with a history of chronic back pain and sciatica, hypertension who presents with worsening pain.  Patient reports over the last 2 to 3 days she has had worsening back pain that radiates into both legs.  She has had issues with this in the past over the last year.  She took an oxycodone at home with no relief.  She reports that her pain is 10 out of 10.  It is worse with ambulating.  Denies weakness, numbness, tingling of the lower extremities, bowel or bladder difficulty.  Patient also reports that she has noted bruising in bilateral thighs.  She denies injury.  No history of blood clots or coagulopathy."   Patient reported to this RN that she has tried icing it as well as heat.   Allergic to prednisone.  Crying off and on all day for this leg.  Crying while on the phone talking to this RN.  Cannot get comfortable in any position.  Numbness and tingling in her leg.  Foot feeling weird.  Radiating down the front of the leg.  Knot on right knee - swollen in the center of knee.  Unable to sleep.  Patient seen by orthopedic surgeon yesterday.  Home exercise therapy and supportive care provided by orthopedic MD.  Rx for Norco was also provided by Orthopedic MD.  Ortho dx of osteoarthritis of the knee.  Patient not getting any relief with home therapy.  Patient advised to go to local ER for further evaluation and treatment of her pain and symptoms.   Patient agreed with plan.    Reason for Disposition . [1] SEVERE pain (e.g., excruciating, unable to walk) AND [2] not improved after 2 hours of pain medicine  Answer Assessment - Initial Assessment Questions 1. LOCATION and RADIATION: "Where is the pain located?"      Right knee 2. QUALITY: "What does the pain feel like?"  (e.g., sharp, dull, aching, burning)     "Aching, throbbing  - I cannot get comfortable. Right  foot feels weird.  There is a know on the right knee in the center of the knee.   3. SEVERITY: "How bad is the pain?" "What does it keep you from doing?"   (Scale 1-10; or mild, moderate, severe)   -  MILD (1-3): doesn't interfere with normal activities    -  MODERATE (4-7): interferes with normal activities (e.g., work or school) or awakens from sleep, limping    -  SEVERE (8-10): excruciating pain, unable to do any normal activities, unable to walk     "10 - I cannot get comfortable.  I cannot get up and down." 4. ONSET: "When did the pain start?" "Does it come and go, or is it there all the time?"     "I went to [facility] ED last night and they prescribed steroids, but I did not pick them up, as I cannot take steroids."   5. RECURRENT: "Have you had this pain before?" If so, ask: "When, and what happened then?"      6. SETTING: "Has there been any recent work, exercise or other activity that involved that part of the body?"       7. AGGRAVATING FACTORS: "What makes the knee pain worse?" (e.g., walking, climbing stairs, running)     8. ASSOCIATED SYMPTOMS: "Is there any swelling or  redness of the knee?"     Knot in center of right knee.  Numbness and tingling in right leg.   9. OTHER SYMPTOMS: "Do you have any other symptoms?" (e.g., chest pain, difficulty breathing, fever, calf pain)     None - they have referred to to see a neurosurgeon.   10. PREGNANCY: "Is there any chance you are pregnant?" "When was your last menstrual period?"  Protocols used: KNEE PAIN-A-AH

## 2019-11-01 NOTE — ED Triage Notes (Signed)
Pt ambulatory to triage with c/o lower mid back pain that radiates to the leg for about a year, pain worse. Seen at [facility] for the same yesterday. Took hydrocodone at 1pm today, says she could not take the steroid that was prescribed.

## 2019-11-02 NOTE — ED Notes (Signed)
Patient educated about not driving or performing other critical tasks (such as operating heavy machinery, caring for infant/toddler/child) due to sedative nature of narcotic medications received while in the ED.  Pt/caregiver verbalized understanding.   

## 2019-11-02 NOTE — Discharge Instructions (Addendum)
Follow-up with the neurosurgeon you were previously referred to.  Use Tylenol or ibuprofen for pain.  Your blood pressure was high today.  Use the resource guide, attached, to help you find a doctor to see for blood pressure check in 1 or 2 weeks.

## 2019-11-07 ENCOUNTER — Other Ambulatory Visit: Payer: Self-pay | Admitting: Neurosurgery

## 2019-11-07 ENCOUNTER — Ambulatory Visit: Payer: Medicare HMO | Admitting: Family Medicine

## 2019-11-07 DIAGNOSIS — M5440 Lumbago with sciatica, unspecified side: Secondary | ICD-10-CM

## 2019-11-07 NOTE — Progress Notes (Deleted)
  Gina Cummings [place] - 54 y.o. female MRN [id number]  Date of birth: 1966/02/01  SUBJECTIVE:  Including CC & ROS.  No chief complaint on file.   Gina Cummings Gina Cummings is a 54 y.o. female that is  ***.  ***   Review of Systems See HPI   HISTORY: Past Medical, Surgical, Social, and Family History Reviewed & Updated per EMR.   Pertinent Historical Findings include:  Past Medical History:  Diagnosis Date  . Asthma   . Chronic back pain   . Chronic, continuous use of opioids   . Drug-seeking behavior   . Hypertension   . Stroke [facility])     Past Surgical History:  Procedure Laterality Date  . ABDOMINAL HYSTERECTOMY    . CHOLECYSTECTOMY    . KNEE SURGERY      Family History  Problem Relation Age of Onset  . Cancer Mother   . CAD Father   . CAD Maternal Grandmother     Social History   Socioeconomic History  . Marital status: Divorced    Spouse name: Not on file  . Number of children: Not on file  . Years of education: Not on file  . Highest education level: Not on file  Occupational History  . Not on file  Tobacco Use  . Smoking status: Never Smoker  . Smokeless tobacco: Never Used  Substance and Sexual Activity  . Alcohol use: Never  . Drug use: Never  . Sexual activity: Not on file  Other Topics Concern  . Not on file  Social History Narrative  . Not on file   Social Determinants of Health   Financial Resource Strain:   . Difficulty of Paying Living Expenses:   Food Insecurity:   . Worried About Programme researcher, broadcasting/film/video in the Last Year:   . Barista in the Last Year:   Transportation Needs:   . Freight forwarder (Medical):   Marland Kitchen Lack of Transportation (Non-Medical):   Physical Activity:   . Days of Exercise per Week:   . Minutes of Exercise per Session:   Stress:   . Feeling of Stress :   Social Connections:   . Frequency of Communication with Friends and Family:   . Frequency of Social Gatherings with Friends and Family:   . Attends  Religious Services:   . Active Member of Clubs or Organizations:   . Attends Banker Meetings:   Marland Kitchen Marital Status:   Intimate Partner Violence:   . Fear of Current or Ex-Partner:   . Emotionally Abused:   Marland Kitchen Physically Abused:   . Sexually Abused:      PHYSICAL EXAM:  VS: There were no vitals taken for this visit. Physical Exam Gen: NAD, alert, cooperative with exam, well-appearing MSK:  ***      ASSESSMENT & PLAN:   No problem-specific Assessment & Plan notes found for this encounter.

## 2019-11-08 ENCOUNTER — Ambulatory Visit: Payer: Medicare HMO | Admitting: Family Medicine

## 2019-11-09 ENCOUNTER — Encounter (HOSPITAL_BASED_OUTPATIENT_CLINIC_OR_DEPARTMENT_OTHER): Payer: Self-pay

## 2019-11-09 ENCOUNTER — Other Ambulatory Visit: Payer: Self-pay

## 2019-11-09 ENCOUNTER — Emergency Department (HOSPITAL_BASED_OUTPATIENT_CLINIC_OR_DEPARTMENT_OTHER)
Admission: EM | Admit: 2019-11-09 | Discharge: 2019-11-09 | Disposition: A | Discharge status: Home / Self Care | Payer: Medicare HMO | Attending: Emergency Medicine | Admitting: Emergency Medicine

## 2019-11-09 DIAGNOSIS — J45909 Unspecified asthma, uncomplicated: Secondary | ICD-10-CM | POA: Insufficient documentation

## 2019-11-09 DIAGNOSIS — I10 Essential (primary) hypertension: Secondary | ICD-10-CM | POA: Diagnosis not present

## 2019-11-09 DIAGNOSIS — I639 Cerebral infarction, unspecified: Secondary | ICD-10-CM | POA: Insufficient documentation

## 2019-11-09 DIAGNOSIS — Z79899 Other long term (current) drug therapy: Secondary | ICD-10-CM | POA: Diagnosis not present

## 2019-11-09 DIAGNOSIS — M79661 Pain in right lower leg: Secondary | ICD-10-CM | POA: Diagnosis present

## 2019-11-09 DIAGNOSIS — M5431 Sciatica, right side: Secondary | ICD-10-CM | POA: Diagnosis not present

## 2019-11-09 MED ORDER — LORAZEPAM 1 MG PO TABS
1.0000 mg | ORAL_TABLET | Freq: Once | ORAL | Status: AC
  Administered 2019-11-09: 18:00:00 1 mg via ORAL
  Filled 2019-11-09: qty 1

## 2019-11-09 MED ORDER — DEXAMETHASONE 4 MG PO TABS: 4.0000 mg | ORAL_TABLET | Freq: Two times a day (BID) | ORAL | 0 refills | Status: DC

## 2019-11-09 MED ORDER — ACETAMINOPHEN 500 MG PO TABS
1000.0000 mg | ORAL_TABLET | Freq: Once | ORAL | Status: AC
  Administered 2019-11-09: 1000 mg via ORAL
  Filled 2019-11-09: qty 2

## 2019-11-09 MED ORDER — METHOCARBAMOL 500 MG PO TABS: 500.0000 mg | ORAL_TABLET | Freq: Three times a day (TID) | ORAL | 0 refills | Status: DC | PRN

## 2019-11-09 MED ORDER — DEXAMETHASONE SODIUM PHOSPHATE 10 MG/ML IJ SOLN
10.0000 mg | Freq: Once | INTRAMUSCULAR | Status: AC
  Administered 2019-11-09: 10 mg via INTRAMUSCULAR
  Filled 2019-11-09: qty 1

## 2019-11-09 MED ORDER — KETOROLAC TROMETHAMINE 15 MG/ML IJ SOLN
15.0000 mg | Freq: Once | INTRAMUSCULAR | Status: AC
  Administered 2019-11-09: 15 mg via INTRAMUSCULAR
  Filled 2019-11-09: qty 1

## 2019-11-09 MED ORDER — HYDROMORPHONE HCL 1 MG/ML IJ SOLN
1.0000 mg | Freq: Once | INTRAMUSCULAR | Status: AC
  Administered 2019-11-09: 1 mg via INTRAMUSCULAR
  Filled 2019-11-09: qty 1

## 2019-11-09 NOTE — Discharge Instructions (Signed)
Folllow-up with Dr Wynetta Emery as previously scheduled.

## 2019-11-09 NOTE — ED Triage Notes (Signed)
Pt c/o pain to right LE x "15 ears"-pt to triage in w/c-grimacing

## 2019-11-09 NOTE — ED Notes (Signed)
ED Provider at bedside.Given po fluids  

## 2019-11-09 NOTE — ED Notes (Signed)
Pt states has had  Back  And rt leg pain for a long time was seen last week for same states saw dr Wynetta Emery but has been able to see him agagin , pt moaning and  Writhing on str, states walks up and down stairs a lot

## 2019-11-14 NOTE — ED Provider Notes (Signed)
[place] EMERGENCY DEPARTMENT Provider Note   CSN: [id number] Arrival date & time: 11/09/19  1701     History Chief Complaint  Patient presents with  . Leg Pain    Gina Cummings is a 54 y.o. female.  HPI   54 year old female with right lower back and right lower extremity pain.  History of sciatica.  Reports that symptoms have been worsening over the past few days.  Denies any acute trauma or strain.  She states that she has been dealing with this issue for many years.  Previously considered pursuing surgery but ultimately decided against it.    Past Medical History:  Diagnosis Date  . Asthma   . Chronic back pain   . Chronic, continuous use of opioids   . Drug-seeking behavior   . Hypertension   . Stroke [facility])     Patient Active Problem List   Diagnosis Date Noted  . OA (osteoarthritis) of knee 10/12/2019    Past Surgical History:  Procedure Laterality Date  . ABDOMINAL HYSTERECTOMY    . CHOLECYSTECTOMY    . KNEE SURGERY       OB History   No obstetric history on file.     Family History  Problem Relation Age of Onset  . Cancer Mother   . CAD Father   . CAD Maternal Grandmother     Social History   Tobacco Use  . Smoking status: Never Smoker  . Smokeless tobacco: Never Used  Substance Use Topics  . Alcohol use: Never  . Drug use: Never    Home Medications Prior to Admission medications   Medication Sig Start Date End Date Taking? Authorizing Provider  amLODipine (NORVASC) 10 MG tablet Take 0.5 tablets (5 mg total) by mouth daily. 03/13/19   Daleen Bo, MD  benzonatate (TESSALON) 100 MG capsule Take 1 capsule (100 mg total) by mouth 3 (three) times daily as needed for cough. Patient not taking: Reported on 04/22/2019 04/15/19   Rodell Perna A, PA-C  cloNIDine (CATAPRES) 0.1 MG tablet Take 1 tablet (0.1 mg total) by mouth 2 (two) times daily. 02/14/19   Ward, Delice Bison, DO  dexamethasone (DECADRON) 4 MG tablet Take 1 tablet (4 mg  total) by mouth 2 (two) times daily. 11/09/19   Virgel Manifold, MD  diclofenac Sodium (VOLTAREN) 1 % GEL Apply 4 g topically 4 (four) times daily. 10/15/19   Tedd Sias, PA  dicyclomine (BENTYL) 20 MG tablet Take 1 tablet (20 mg total) by mouth 2 (two) times daily. Patient not taking: Reported on 07/17/2019 05/18/19   Caccavale, Sophia, PA-C  famotidine (PEPCID) 20 MG tablet Take 1 tablet (20 mg total) by mouth 2 (two) times daily. 10/14/19   Fawze, Mina A, PA-C  hydrochlorothiazide (HYDRODIURIL) 25 MG tablet Take 1 tablet (25 mg total) by mouth daily. Patient not taking: Reported on 05/18/2019 02/14/19   Ward, Delice Bison, DO  HYDROcodone-acetaminophen (NORCO/VICODIN) 5-325 MG tablet Take 1 tablet by mouth every 8 (eight) hours as needed. 10/31/19   Rosemarie Ax, MD  lidocaine (LMX) 4 % cream Apply 1 application topically 3 (three) times daily as needed. 10/14/19   Fawze, Mina A, PA-C  methocarbamol (ROBAXIN) 500 MG tablet Take 1 tablet (500 mg total) by mouth every 8 (eight) hours as needed for muscle spasms. 11/09/19   Virgel Manifold, MD  methylPREDNISolone (MEDROL DOSEPAK) 4 MG TBPK tablet Take as directed on packet 10/31/19   Horton, Barbette Hair, MD  ondansetron (ZOFRAN ODT)  4 MG disintegrating tablet Take 1 tablet (4 mg total) by mouth every 8 (eight) hours as needed for nausea or vomiting. 10/14/19   Fawze, Mina A, PA-C  ondansetron (ZOFRAN) 4 MG tablet Take 1 tablet (4 mg total) by mouth every 8 (eight) hours as needed for nausea or vomiting. Patient not taking: Reported on 07/17/2019 04/15/19   Michela Pitcher A, PA-C  oxyCODONE (ROXICODONE) 15 MG immediate release tablet Take 1 tablet (15 mg total) by mouth every 4 (four) hours as needed. Patient taking differently: Take 15 mg by mouth every 4 (four) hours as needed for pain.  03/15/19   Melene Plan, DO  oxyCODONE-acetaminophen (PERCOCET/ROXICET) 5-325 MG tablet Take 1 tablet by mouth every 6 (six) hours as needed for severe pain. 10/04/19   Long, Arlyss Repress,  MD  predniSONE (DELTASONE) 5 MG tablet Take 6 pills for first day, 5 pills second day, 4 pills third day, 3 pills fourth day, 2 pills the fifth day, and 1 pill sixth day. 10/28/19   Myra Rude, MD  promethazine (PHENERGAN) 12.5 MG tablet Take 1 tablet (12.5 mg total) by mouth every 6 (six) hours as needed for nausea or vomiting. Patient not taking: Reported on 04/22/2019 02/09/19   Petrucelli, Samantha R, PA-C  senna-docusate (SENOKOT-S) 8.6-50 MG tablet Take 1 tablet by mouth at bedtime as needed for mild constipation or moderate constipation. 10/04/19   Long, Arlyss Repress, MD    Allergies    Peanut-containing drug products, Penicillins, Aspirin, Gabapentin, Ibuprofen, Lisinopril, Morphine and related, Nsaids, Shellfish allergy, Tramadol, Trazodone, Fentanyl, and Morphine  Review of Systems   Review of Systems All systems reviewed and negative, other than as noted in HPI.  Physical Exam Updated Vital Signs BP (!) 154/87 (BP Location: Right Arm)   Pulse 90   Temp 98.5 F (36.9 C) (Oral)   Resp 18   SpO2 98%   Physical Exam Vitals and nursing note reviewed.  Constitutional:      General: She is not in acute distress.    Appearance: She is well-developed.     Comments: Laying in bed.  Appears comfortable.  HENT:     Head: Normocephalic and atraumatic.  Eyes:     General:        Right eye: No discharge.        Left eye: No discharge.     Conjunctiva/sclera: Conjunctivae normal.  Cardiovascular:     Rate and Rhythm: Normal rate and regular rhythm.     Heart sounds: Normal heart sounds. No murmur. No friction rub. No gallop.   Pulmonary:     Effort: Pulmonary effort is normal. No respiratory distress.     Breath sounds: Normal breath sounds.  Abdominal:     General: There is no distension.     Palpations: Abdomen is soft.     Tenderness: There is no abdominal tenderness.  Musculoskeletal:     Cervical back: Neck supple.     Comments: Tenderness along the right buttock and  upper posterior thigh.  No appreciable spinal tenderness.  Positive straight leg test.  Neurovascular intact distally.  Skin:    General: Skin is warm and dry.  Neurological:     Mental Status: She is alert.  Psychiatric:        Behavior: Behavior normal.        Thought Content: Thought content normal.     ED Results / Procedures / Treatments   Labs (all labs ordered are listed, but only abnormal  results are displayed) Labs Reviewed - No data to display  EKG None  Radiology No results found.  Procedures Procedures (including critical care time)  Medications Ordered in ED Medications  HYDROmorphone (DILAUDID) injection 1 mg (1 mg Intramuscular Given 11/09/19 1817)  dexamethasone (DECADRON) injection 10 mg (10 mg Intramuscular Given 11/09/19 1813)  LORazepam (ATIVAN) tablet 1 mg (1 mg Oral Given 11/09/19 1813)  acetaminophen (TYLENOL) tablet 1,000 mg (1,000 mg Oral Given 11/09/19 1813)  ketorolac (TORADOL) 15 MG/ML injection 15 mg (15 mg Intramuscular Given 11/09/19 1815)    ED Course  I have reviewed the triage vital signs and the nursing notes.  Pertinent labs & imaging results that were available during my care of the patient were reviewed by me and considered in my medical decision making (see chart for details).    MDM Rules/Calculators/A&P                      54 year old female with symptoms consistent with sciatica.  As needed NSAIDs.  Course of Decadron.  Return precautions discussed.  Outpatient follow-up otherwise. Final Clinical Impression(s) / ED Diagnoses Final diagnoses:  Sciatica of right side    Rx / DC Orders ED Discharge Orders         Ordered    methocarbamol (ROBAXIN) 500 MG tablet  Every 8 hours PRN     11/09/19 1923    dexamethasone (DECADRON) 4 MG tablet  2 times daily     11/09/19 1923           Raeford Razor, MD 11/14/19 1236

## 2019-11-30 ENCOUNTER — Ambulatory Visit
Admission: RE | Admit: 2019-11-30 | Discharge: 2019-11-30 | Disposition: A | Discharge status: Home / Self Care | Payer: Medicare HMO | Source: Ambulatory Visit | Attending: Neurosurgery | Admitting: Neurosurgery

## 2019-11-30 ENCOUNTER — Other Ambulatory Visit: Payer: Self-pay

## 2019-11-30 DIAGNOSIS — M5440 Lumbago with sciatica, unspecified side: Secondary | ICD-10-CM

## 2019-12-21 ENCOUNTER — Other Ambulatory Visit: Payer: Self-pay | Admitting: Neurosurgery

## 2020-01-02 ENCOUNTER — Other Ambulatory Visit: Payer: Self-pay

## 2020-01-02 ENCOUNTER — Encounter (HOSPITAL_COMMUNITY): Payer: Self-pay

## 2020-01-02 ENCOUNTER — Encounter (HOSPITAL_COMMUNITY)
Admission: RE | Admit: 2020-01-02 | Discharge: 2020-01-02 | Disposition: A | Discharge status: Home / Self Care | Payer: Medicare HMO | Source: Ambulatory Visit | Attending: Neurosurgery | Admitting: Neurosurgery

## 2020-01-02 ENCOUNTER — Other Ambulatory Visit (HOSPITAL_COMMUNITY)
Admission: RE | Admit: 2020-01-02 | Discharge: 2020-01-02 | Disposition: A | Discharge status: Home / Self Care | Payer: Medicare HMO | Source: Ambulatory Visit | Attending: Neurosurgery | Admitting: Neurosurgery

## 2020-01-02 DIAGNOSIS — Z01812 Encounter for preprocedural laboratory examination: Secondary | ICD-10-CM | POA: Insufficient documentation

## 2020-01-02 DIAGNOSIS — Z20822 Contact with and (suspected) exposure to covid-19: Secondary | ICD-10-CM | POA: Diagnosis not present

## 2020-01-02 LAB — SURGICAL PCR SCREEN
MRSA, PCR: NEGATIVE
Staphylococcus aureus: NEGATIVE

## 2020-01-02 LAB — CBC
HCT: 42.1 % (ref 36.0–46.0)
Hemoglobin: 13.3 g/dL (ref 12.0–15.0)
MCH: 30.6 pg (ref 26.0–34.0)
MCHC: 31.6 g/dL (ref 30.0–36.0)
MCV: 96.8 fL (ref 80.0–100.0)
Platelets: 303 10*3/uL (ref 150–400)
RBC: 4.35 MIL/uL (ref 3.87–5.11)
RDW: 12.8 % (ref 11.5–15.5)
WBC: 3.6 10*3/uL — ABNORMAL LOW (ref 4.0–10.5)
nRBC: 0 % (ref 0.0–0.2)

## 2020-01-02 LAB — BASIC METABOLIC PANEL
Anion gap: 9 (ref 5–15)
BUN: 18 mg/dL (ref 6–20)
CO2: 22 mmol/L (ref 22–32)
Calcium: 9.2 mg/dL (ref 8.9–10.3)
Chloride: 107 mmol/L (ref 98–111)
Creatinine, Ser: 0.85 mg/dL (ref 0.44–1.00)
GFR calc Af Amer: >60 mL/min (ref >60)
GFR calc non Af Amer: >60 mL/min (ref >60)
Glucose, Bld: 93 mg/dL (ref 70–99)
Potassium: 4.4 mmol/L (ref 3.5–5.1)
Sodium: 138 mmol/L (ref 135–145)

## 2020-01-02 LAB — TYPE AND SCREEN
ABO/RH(D): B POS
Antibody Screen: NEGATIVE

## 2020-01-02 LAB — ABO/RH: ABO/RH(D): B POS

## 2020-01-02 LAB — SARS CORONAVIRUS 2 (TAT 6-24 HRS): SARS Coronavirus 2: NEGATIVE

## 2020-01-02 NOTE — Progress Notes (Signed)
PCP - denies Cardiologist - denies  Chest x-ray - 07/17/19 EKG - 04/22/19 Stress Test - denies ECHO - denies Cardiac Cath - denies  COVID TEST- 01/02/20   Anesthesia review: no  Patient denies shortness of breath, fever, cough and chest pain at PAT appointment   All instructions explained to the patient, with a verbal understanding of the material. Patient agrees to go over the instructions while at home for a better understanding. Patient also instructed to self quarantine after being tested for COVID-19. The opportunity to ask questions was provided.

## 2020-01-02 NOTE — Progress Notes (Signed)
[id number] [address] [place] [address] Phone: [phone] Fax: [phone]      Your procedure is scheduled on Wednesday June 30  Report to [facility] Main Entrance "A" at 0530 A.M., and check in at the Admitting office.  Call this number if you have problems the morning of surgery:  [phone]  Call [phone] if you have any questions prior to your surgery date Monday-Friday 8am-4pm    Remember:  Do not eat or drink after midnight the night before your surgery     Take these medicines the morning of surgery with A SIP OF WATER  ADVAIR DISKUS Please bring all inhalers with you the day of surgery.  albuterol (VENTOLIN HFA)  diazepam (VALIUM) amLODipine (NORVASC)  dexamethasone (DECADRON famotidine (PEPCID) HYDROcodone-acetaminophen (NORCO/VICODIN)  If needed for pain  As of today, STOP taking any Aspirin (unless otherwise instructed by your surgeon), Aleve, Naproxen, Ibuprofen, Motrin, Advil, Goody's, BC's, all herbal medications, fish oil, and all vitamins.                      Do not wear jewelry, make up, or nail polish            Do not wear lotions, powders, perfumes, or deodorant.            Do not shave 48 hours prior to surgery.              Do not bring valuables to the hospital.            [facility] is not responsible for any belongings or valuables.  Do NOT Smoke (Tobacco/Vapping) or drink Alcohol 24 hours prior to your procedure If you use a CPAP at night, you may bring all equipment for your overnight stay.   Contacts, glasses, dentures or bridgework may not be worn into surgery.      For patients admitted to the hospital, discharge time will be determined by your treatment team.   Patients discharged the day of surgery will not be allowed to drive home, and someone needs to stay with them for 24 hours.    Special instructions:   [place]- Preparing For Surgery  Before surgery, you can play  an important role. Because skin is not sterile, your skin needs to be as free of germs as possible. You can reduce the number of germs on your skin by washing with CHG (chlorahexidine gluconate) Soap before surgery.  CHG is an antiseptic cleaner which kills germs and bonds with the skin to continue killing germs even after washing.    Oral Hygiene is also important to reduce your risk of infection.  Remember - BRUSH YOUR TEETH THE MORNING OF SURGERY WITH YOUR REGULAR TOOTHPASTE  Please do not use if you have an allergy to CHG or antibacterial soaps. If your skin becomes reddened/irritated stop using the CHG.  Do not shave (including legs and underarms) for at least 48 hours prior to first CHG shower. It is OK to shave your face.  Please follow these instructions carefully.   1. Shower the NIGHT BEFORE SURGERY and the MORNING OF SURGERY with CHG Soap.   2. If you chose to wash your hair, wash your hair first as usual with your normal shampoo.  3. After you shampoo, rinse your hair and body thoroughly to remove the shampoo.  4. Use CHG as you would any other liquid soap. You can apply CHG directly  to the skin and wash gently with a scrungie or a clean washcloth.   5. Apply the CHG Soap to your body ONLY FROM THE NECK DOWN.  Do not use on open wounds or open sores. Avoid contact with your eyes, ears, mouth and genitals (private parts). Wash Face and genitals (private parts)  with your normal soap.   6. Wash thoroughly, paying special attention to the area where your surgery will be performed.  7. Thoroughly rinse your body with warm water from the neck down.  8. DO NOT shower/wash with your normal soap after using and rinsing off the CHG Soap.  9. Pat yourself dry with a CLEAN TOWEL.  10. Wear CLEAN PAJAMAS to bed the night before surgery, wear comfortable clothes the morning of surgery  11. Place CLEAN SHEETS on your bed the night of your first shower and DO NOT SLEEP WITH PETS.   Day  of Surgery:   Do not apply any deodorants/lotions.  Please wear clean clothes to the hospital/surgery center.   Remember to brush your teeth WITH YOUR REGULAR TOOTHPASTE.   Please read over the following fact sheets that you were given.

## 2020-01-04 ENCOUNTER — Ambulatory Visit (HOSPITAL_COMMUNITY): Payer: Medicare HMO | Admitting: Anesthesiology

## 2020-01-04 ENCOUNTER — Inpatient Hospital Stay (HOSPITAL_COMMUNITY)
Admission: AD | Disposition: A | Discharge status: Home Health | Payer: Self-pay | Source: Home / Self Care | Attending: Neurosurgery

## 2020-01-04 ENCOUNTER — Encounter (HOSPITAL_COMMUNITY): Payer: Self-pay | Admitting: Neurosurgery

## 2020-01-04 ENCOUNTER — Other Ambulatory Visit: Payer: Self-pay

## 2020-01-04 ENCOUNTER — Inpatient Hospital Stay (HOSPITAL_COMMUNITY)
Admission: AD | Admit: 2020-01-04 | Discharge: 2020-01-09 | DRG: 460 | Disposition: A | Discharge status: Home Health | Payer: Medicare HMO | Attending: Neurosurgery | Admitting: Neurosurgery

## 2020-01-04 ENCOUNTER — Ambulatory Visit (HOSPITAL_COMMUNITY): Payer: Medicare HMO

## 2020-01-04 DIAGNOSIS — Z79899 Other long term (current) drug therapy: Secondary | ICD-10-CM

## 2020-01-04 DIAGNOSIS — M48061 Spinal stenosis, lumbar region without neurogenic claudication: Secondary | ICD-10-CM | POA: Diagnosis present

## 2020-01-04 DIAGNOSIS — I10 Essential (primary) hypertension: Secondary | ICD-10-CM | POA: Diagnosis present

## 2020-01-04 DIAGNOSIS — Z419 Encounter for procedure for purposes other than remedying health state, unspecified: Secondary | ICD-10-CM

## 2020-01-04 DIAGNOSIS — J9811 Atelectasis: Secondary | ICD-10-CM | POA: Diagnosis not present

## 2020-01-04 DIAGNOSIS — Z8249 Family history of ischemic heart disease and other diseases of the circulatory system: Secondary | ICD-10-CM

## 2020-01-04 DIAGNOSIS — M4316 Spondylolisthesis, lumbar region: Principal | ICD-10-CM

## 2020-01-04 DIAGNOSIS — Z885 Allergy status to narcotic agent status: Secondary | ICD-10-CM

## 2020-01-04 DIAGNOSIS — M4726 Other spondylosis with radiculopathy, lumbar region: Secondary | ICD-10-CM | POA: Diagnosis present

## 2020-01-04 DIAGNOSIS — Z880 Allergy status to penicillin: Secondary | ICD-10-CM

## 2020-01-04 DIAGNOSIS — Z8673 Personal history of transient ischemic attack (TIA), and cerebral infarction without residual deficits: Secondary | ICD-10-CM

## 2020-01-04 DIAGNOSIS — Z886 Allergy status to analgesic agent status: Secondary | ICD-10-CM

## 2020-01-04 DIAGNOSIS — Z888 Allergy status to other drugs, medicaments and biological substances status: Secondary | ICD-10-CM

## 2020-01-04 DIAGNOSIS — J45909 Unspecified asthma, uncomplicated: Secondary | ICD-10-CM | POA: Diagnosis present

## 2020-01-04 SURGERY — POSTERIOR LUMBAR FUSION 1 LEVEL
Anesthesia: General | Site: Back

## 2020-01-04 MED ORDER — OXYCODONE HCL 5 MG PO TABS
5.0000 mg | ORAL_TABLET | Freq: Once | ORAL | Status: AC | PRN
  Administered 2020-01-04: 5 mg via ORAL

## 2020-01-04 MED ORDER — HYDROMORPHONE HCL 1 MG/ML IJ SOLN
INTRAMUSCULAR | Status: DC | PRN
  Administered 2020-01-04 (×2): .5 mg via INTRAVENOUS

## 2020-01-04 MED ORDER — ROCURONIUM BROMIDE 10 MG/ML (PF) SYRINGE
PREFILLED_SYRINGE | INTRAVENOUS | Status: AC
  Filled 2020-01-04: qty 10

## 2020-01-04 MED ORDER — SCOPOLAMINE 1 MG/3DAYS TD PT72
1.0000 | MEDICATED_PATCH | TRANSDERMAL | Status: DC
  Administered 2020-01-04: 1.5 mg via TRANSDERMAL
  Filled 2020-01-04: qty 1

## 2020-01-04 MED ORDER — BUPIVACAINE LIPOSOME 1.3 % IJ SUSP
20.0000 mL | Freq: Once | INTRAMUSCULAR | Status: DC
  Filled 2020-01-04: qty 20

## 2020-01-04 MED ORDER — CEFAZOLIN SODIUM-DEXTROSE 2-4 GM/100ML-% IV SOLN
INTRAVENOUS | Status: AC
  Filled 2020-01-04: qty 100

## 2020-01-04 MED ORDER — FENTANYL CITRATE (PF) 250 MCG/5ML IJ SOLN
INTRAMUSCULAR | Status: AC
  Filled 2020-01-04: qty 5

## 2020-01-04 MED ORDER — ACETAMINOPHEN 325 MG PO TABS
650.0000 mg | ORAL_TABLET | ORAL | Status: DC | PRN
  Administered 2020-01-04 – 2020-01-09 (×8): 650 mg via ORAL
  Filled 2020-01-04 (×9): qty 2

## 2020-01-04 MED ORDER — SODIUM CHLORIDE 0.9 % IV SOLN
INTRAVENOUS | Status: DC | PRN
  Administered 2020-01-04: 500 mL

## 2020-01-04 MED ORDER — CHLORHEXIDINE GLUCONATE 0.12 % MT SOLN
15.0000 mL | Freq: Once | OROMUCOSAL | Status: AC
  Administered 2020-01-04: 15 mL via OROMUCOSAL
  Filled 2020-01-04: qty 15

## 2020-01-04 MED ORDER — BUPIVACAINE HCL (PF) 0.25 % IJ SOLN
INTRAMUSCULAR | Status: AC
  Filled 2020-01-04: qty 30

## 2020-01-04 MED ORDER — HYDROMORPHONE HCL 1 MG/ML IJ SOLN
0.5000 mg | INTRAMUSCULAR | Status: DC | PRN
  Administered 2020-01-04 – 2020-01-08 (×16): 0.5 mg via INTRAVENOUS
  Filled 2020-01-04: qty 1
  Filled 2020-01-04 (×3): qty 0.5
  Filled 2020-01-04 (×4): qty 1
  Filled 2020-01-04: qty 0.5
  Filled 2020-01-04 (×7): qty 1

## 2020-01-04 MED ORDER — LABETALOL HCL 5 MG/ML IV SOLN
INTRAVENOUS | Status: AC
  Filled 2020-01-04: qty 4

## 2020-01-04 MED ORDER — PROPOFOL 10 MG/ML IV BOLUS
INTRAVENOUS | Status: AC
  Filled 2020-01-04: qty 20

## 2020-01-04 MED ORDER — THROMBIN 20000 UNITS EX SOLR
CUTANEOUS | Status: DC | PRN
  Administered 2020-01-04: 20 mL via TOPICAL

## 2020-01-04 MED ORDER — SODIUM CHLORIDE 0.9% FLUSH: 3.0000 mL | INTRAVENOUS | Status: DC | PRN

## 2020-01-04 MED ORDER — CEFAZOLIN SODIUM-DEXTROSE 2-4 GM/100ML-% IV SOLN
2.0000 g | Freq: Three times a day (TID) | INTRAVENOUS | Status: AC
  Administered 2020-01-04 (×2): 2 g via INTRAVENOUS
  Filled 2020-01-04 (×2): qty 100

## 2020-01-04 MED ORDER — PROMETHAZINE HCL 25 MG/ML IJ SOLN: 6.2500 mg | INTRAMUSCULAR | Status: DC | PRN

## 2020-01-04 MED ORDER — THROMBIN 20000 UNITS EX KIT
PACK | CUTANEOUS | Status: AC
  Filled 2020-01-04: qty 1

## 2020-01-04 MED ORDER — CYCLOBENZAPRINE HCL 10 MG PO TABS
ORAL_TABLET | ORAL | Status: AC
  Filled 2020-01-04: qty 1

## 2020-01-04 MED ORDER — GLYCOPYRROLATE PF 0.2 MG/ML IJ SOSY
PREFILLED_SYRINGE | INTRAMUSCULAR | Status: DC | PRN
Start: 2020-01-04 — End: 2020-01-04
  Administered 2020-01-04: .2 mg via INTRAVENOUS

## 2020-01-04 MED ORDER — PHENYLEPHRINE 40 MCG/ML (10ML) SYRINGE FOR IV PUSH (FOR BLOOD PRESSURE SUPPORT)
PREFILLED_SYRINGE | INTRAVENOUS | Status: DC | PRN
  Administered 2020-01-04: 80 ug via INTRAVENOUS

## 2020-01-04 MED ORDER — LIDOCAINE 2% (20 MG/ML) 5 ML SYRINGE
INTRAMUSCULAR | Status: AC
  Filled 2020-01-04: qty 5

## 2020-01-04 MED ORDER — ONDANSETRON HCL 4 MG/2ML IJ SOLN
INTRAMUSCULAR | Status: DC | PRN
  Administered 2020-01-04: 4 mg via INTRAVENOUS

## 2020-01-04 MED ORDER — HYDROMORPHONE HCL 1 MG/ML IJ SOLN
INTRAMUSCULAR | Status: AC
  Filled 2020-01-04: qty 0.5

## 2020-01-04 MED ORDER — DIAZEPAM 5 MG/ML IJ SOLN
INTRAMUSCULAR | Status: AC
  Filled 2020-01-04: qty 2

## 2020-01-04 MED ORDER — ONDANSETRON HCL 4 MG PO TABS: 4.0000 mg | ORAL_TABLET | Freq: Four times a day (QID) | ORAL | Status: DC | PRN

## 2020-01-04 MED ORDER — CEFAZOLIN SODIUM-DEXTROSE 2-3 GM-%(50ML) IV SOLR
INTRAVENOUS | Status: DC | PRN
Start: 2020-01-04 — End: 2020-01-04
  Administered 2020-01-04: 2 g via INTRAVENOUS

## 2020-01-04 MED ORDER — HYDRALAZINE HCL 20 MG/ML IJ SOLN: 5.0000 mg | INTRAMUSCULAR | Status: DC | PRN

## 2020-01-04 MED ORDER — VANCOMYCIN HCL IN DEXTROSE 1-5 GM/200ML-% IV SOLN: 1000.0000 mg | INTRAVENOUS | Status: DC

## 2020-01-04 MED ORDER — LACTATED RINGERS IV SOLN: INTRAVENOUS | Status: DC

## 2020-01-04 MED ORDER — OXYCODONE HCL 5 MG/5ML PO SOLN: 5.0000 mg | Freq: Once | ORAL | Status: AC | PRN

## 2020-01-04 MED ORDER — BUPIVACAINE LIPOSOME 1.3 % IJ SUSP
INTRAMUSCULAR | Status: DC | PRN
  Administered 2020-01-04: 20 mL

## 2020-01-04 MED ORDER — DEXAMETHASONE SODIUM PHOSPHATE 10 MG/ML IJ SOLN: 10.0000 mg | Freq: Once | INTRAMUSCULAR | Status: DC

## 2020-01-04 MED ORDER — HYDROMORPHONE HCL 1 MG/ML IJ SOLN
INTRAMUSCULAR | Status: AC
  Filled 2020-01-04: qty 1

## 2020-01-04 MED ORDER — ORAL CARE MOUTH RINSE: 15.0000 mL | Freq: Once | OROMUCOSAL | Status: AC

## 2020-01-04 MED ORDER — THROMBIN 20000 UNITS EX SOLR
CUTANEOUS | Status: AC
  Filled 2020-01-04: qty 20000

## 2020-01-04 MED ORDER — CHLORHEXIDINE GLUCONATE CLOTH 2 % EX PADS: 6.0000 | MEDICATED_PAD | Freq: Once | CUTANEOUS | Status: DC

## 2020-01-04 MED ORDER — OXYCODONE HCL 5 MG PO TABS
ORAL_TABLET | ORAL | Status: AC
  Filled 2020-01-04: qty 1

## 2020-01-04 MED ORDER — ONDANSETRON HCL 4 MG/2ML IJ SOLN
4.0000 mg | Freq: Four times a day (QID) | INTRAMUSCULAR | Status: DC | PRN
  Administered 2020-01-06: 4 mg via INTRAVENOUS
  Filled 2020-01-04: qty 2

## 2020-01-04 MED ORDER — MOMETASONE FURO-FORMOTEROL FUM 100-5 MCG/ACT IN AERO
2.0000 | INHALATION_SPRAY | Freq: Two times a day (BID) | RESPIRATORY_TRACT | Status: DC
  Administered 2020-01-04 – 2020-01-09 (×8): 2 via RESPIRATORY_TRACT
  Filled 2020-01-04 (×2): qty 8.8

## 2020-01-04 MED ORDER — MIDAZOLAM HCL 5 MG/5ML IJ SOLN
INTRAMUSCULAR | Status: DC | PRN
  Administered 2020-01-04: 2 mg via INTRAVENOUS

## 2020-01-04 MED ORDER — ACETAMINOPHEN 500 MG PO TABS
1000.0000 mg | ORAL_TABLET | Freq: Once | ORAL | Status: AC
  Administered 2020-01-04: 1000 mg via ORAL
  Filled 2020-01-04: qty 2

## 2020-01-04 MED ORDER — SUGAMMADEX SODIUM 200 MG/2ML IV SOLN
INTRAVENOUS | Status: DC | PRN
  Administered 2020-01-04: 200 mg via INTRAVENOUS

## 2020-01-04 MED ORDER — ONDANSETRON HCL 4 MG/2ML IJ SOLN
INTRAMUSCULAR | Status: AC
  Filled 2020-01-04: qty 2

## 2020-01-04 MED ORDER — LIDOCAINE-EPINEPHRINE 1 %-1:100000 IJ SOLN
INTRAMUSCULAR | Status: AC
  Filled 2020-01-04: qty 1

## 2020-01-04 MED ORDER — PROPOFOL 10 MG/ML IV BOLUS
INTRAVENOUS | Status: DC | PRN
  Administered 2020-01-04: 200 mg via INTRAVENOUS

## 2020-01-04 MED ORDER — DEXAMETHASONE SODIUM PHOSPHATE 10 MG/ML IJ SOLN
INTRAMUSCULAR | Status: DC | PRN
  Administered 2020-01-04: 8 mg via INTRAVENOUS

## 2020-01-04 MED ORDER — ALUM & MAG HYDROXIDE-SIMETH 200-200-20 MG/5ML PO SUSP: 30.0000 mL | Freq: Four times a day (QID) | ORAL | Status: DC | PRN

## 2020-01-04 MED ORDER — ALBUTEROL SULFATE HFA 108 (90 BASE) MCG/ACT IN AERS: 1.0000 | INHALATION_SPRAY | Freq: Four times a day (QID) | RESPIRATORY_TRACT | Status: DC | PRN

## 2020-01-04 MED ORDER — PHENOL 1.4 % MT LIQD: 1.0000 | OROMUCOSAL | Status: DC | PRN

## 2020-01-04 MED ORDER — KETAMINE HCL 10 MG/ML IJ SOLN
INTRAMUSCULAR | Status: DC | PRN
Start: 2020-01-04 — End: 2020-01-04
  Administered 2020-01-04: 10 mg via INTRAVENOUS
  Administered 2020-01-04: 20 mg via INTRAVENOUS
  Administered 2020-01-04: 50 mg via INTRAVENOUS

## 2020-01-04 MED ORDER — ALBUMIN HUMAN 5 % IV SOLN: INTRAVENOUS | Status: DC | PRN

## 2020-01-04 MED ORDER — KETAMINE HCL 100 MG/ML IJ SOLN
INTRAMUSCULAR | Status: AC
  Filled 2020-01-04: qty 1

## 2020-01-04 MED ORDER — THROMBIN 5000 UNITS EX SOLR
CUTANEOUS | Status: AC
  Filled 2020-01-04: qty 5000

## 2020-01-04 MED ORDER — DEXAMETHASONE SODIUM PHOSPHATE 10 MG/ML IJ SOLN
INTRAMUSCULAR | Status: AC
  Filled 2020-01-04: qty 1

## 2020-01-04 MED ORDER — SODIUM CHLORIDE 0.9% FLUSH
3.0000 mL | Freq: Two times a day (BID) | INTRAVENOUS | Status: DC
  Administered 2020-01-04 – 2020-01-09 (×9): 3 mL via INTRAVENOUS

## 2020-01-04 MED ORDER — FENTANYL CITRATE (PF) 250 MCG/5ML IJ SOLN
INTRAMUSCULAR | Status: DC | PRN
  Administered 2020-01-04: 50 ug via INTRAVENOUS
  Administered 2020-01-04: 25 ug via INTRAVENOUS
  Administered 2020-01-04: 150 ug via INTRAVENOUS
  Administered 2020-01-04: 25 ug via INTRAVENOUS

## 2020-01-04 MED ORDER — OXYCODONE HCL 5 MG PO TABS
15.0000 mg | ORAL_TABLET | ORAL | Status: DC | PRN
  Administered 2020-01-04 – 2020-01-09 (×20): 15 mg via ORAL
  Filled 2020-01-04 (×21): qty 3

## 2020-01-04 MED ORDER — ROCURONIUM BROMIDE 10 MG/ML (PF) SYRINGE
PREFILLED_SYRINGE | INTRAVENOUS | Status: DC | PRN
  Administered 2020-01-04: 10 mg via INTRAVENOUS
  Administered 2020-01-04: 20 mg via INTRAVENOUS
  Administered 2020-01-04: 30 mg via INTRAVENOUS
  Administered 2020-01-04: 100 mg via INTRAVENOUS

## 2020-01-04 MED ORDER — HYDROMORPHONE HCL 1 MG/ML IJ SOLN
0.2500 mg | INTRAMUSCULAR | Status: DC | PRN
  Administered 2020-01-04 (×4): 0.5 mg via INTRAVENOUS

## 2020-01-04 MED ORDER — MIDAZOLAM HCL 2 MG/2ML IJ SOLN
INTRAMUSCULAR | Status: AC
  Filled 2020-01-04: qty 2

## 2020-01-04 MED ORDER — PANTOPRAZOLE SODIUM 40 MG PO TBEC
40.0000 mg | DELAYED_RELEASE_TABLET | Freq: Every day | ORAL | Status: DC
  Administered 2020-01-04 – 2020-01-09 (×6): 40 mg via ORAL
  Filled 2020-01-04 (×6): qty 1

## 2020-01-04 MED ORDER — 0.9 % SODIUM CHLORIDE (POUR BTL) OPTIME
TOPICAL | Status: DC | PRN
  Administered 2020-01-04: 1000 mL

## 2020-01-04 MED ORDER — LIDOCAINE-EPINEPHRINE 1 %-1:100000 IJ SOLN
INTRAMUSCULAR | Status: DC | PRN
  Administered 2020-01-04: 10 mL

## 2020-01-04 MED ORDER — DIAZEPAM 2 MG PO TABS
5.0000 mg | ORAL_TABLET | Freq: Four times a day (QID) | ORAL | Status: DC | PRN
  Administered 2020-01-04 – 2020-01-09 (×10): 5 mg via ORAL
  Filled 2020-01-04: qty 1
  Filled 2020-01-04 (×3): qty 3
  Filled 2020-01-04 (×3): qty 1
  Filled 2020-01-04: qty 3
  Filled 2020-01-04 (×2): qty 1

## 2020-01-04 MED ORDER — MEPERIDINE HCL 25 MG/ML IJ SOLN: 6.2500 mg | INTRAMUSCULAR | Status: DC | PRN

## 2020-01-04 MED ORDER — LIDOCAINE 2% (20 MG/ML) 5 ML SYRINGE
INTRAMUSCULAR | Status: DC | PRN
  Administered 2020-01-04: 60 mg via INTRAVENOUS

## 2020-01-04 MED ORDER — PHENYLEPHRINE 40 MCG/ML (10ML) SYRINGE FOR IV PUSH (FOR BLOOD PRESSURE SUPPORT)
PREFILLED_SYRINGE | INTRAVENOUS | Status: AC
  Filled 2020-01-04: qty 10

## 2020-01-04 MED ORDER — SODIUM CHLORIDE 0.9 % IV SOLN
250.0000 mL | INTRAVENOUS | Status: DC
  Administered 2020-01-04: 250 mL via INTRAVENOUS

## 2020-01-04 MED ORDER — LABETALOL HCL 5 MG/ML IV SOLN
INTRAVENOUS | Status: DC | PRN
Start: 2020-01-04 — End: 2020-01-04
  Administered 2020-01-04 (×2): 5 mg via INTRAVENOUS
  Administered 2020-01-04: 10 mg via INTRAVENOUS

## 2020-01-04 MED ORDER — ACETAMINOPHEN 650 MG RE SUPP
650.0000 mg | RECTAL | Status: DC | PRN
  Administered 2020-01-06: 650 mg via RECTAL
  Filled 2020-01-04: qty 1

## 2020-01-04 MED ORDER — MENTHOL 3 MG MT LOZG: 1.0000 | LOZENGE | OROMUCOSAL | Status: DC | PRN

## 2020-01-04 MED ORDER — PANTOPRAZOLE SODIUM 40 MG IV SOLR: 40.0000 mg | Freq: Every day | INTRAVENOUS | Status: DC

## 2020-01-04 MED ORDER — CYCLOBENZAPRINE HCL 10 MG PO TABS
10.0000 mg | ORAL_TABLET | Freq: Three times a day (TID) | ORAL | Status: DC | PRN
  Administered 2020-01-04: 10 mg via ORAL

## 2020-01-04 SURGICAL SUPPLY — 76 items
BAG DECANTER FOR FLEXI CONT (MISCELLANEOUS) ×3 IMPLANT
BASKET BONE COLLECTION (BASKET) ×3 IMPLANT
BENZOIN TINCTURE PRP APPL 2/3 (GAUZE/BANDAGES/DRESSINGS) ×3 IMPLANT
BIT DRILL 5.0/4.0 (BIT) ×1 IMPLANT
BLADE CLIPPER SURG (BLADE) IMPLANT
BLADE SURG 11 STRL SS (BLADE) ×3 IMPLANT
BUR CUTTER 7.0 ROUND (BURR) ×3 IMPLANT
BUR MATCHSTICK NEURO 3.0 LAGG (BURR) ×3 IMPLANT
CANISTER SUCT 3000ML PPV (MISCELLANEOUS) ×3 IMPLANT
CAP LOCKING (Cap) ×12 IMPLANT
CAP LOCKING 5.5 CREO (Cap) ×4 IMPLANT
CARTRIDGE OIL MAESTRO DRILL (MISCELLANEOUS) ×1 IMPLANT
CLOSURE WOUND 1/2 X4 (GAUZE/BANDAGES/DRESSINGS) ×1
CNTNR URN SCR LID CUP LEK RST (MISCELLANEOUS) ×1 IMPLANT
CONT SPEC 4OZ STRL OR WHT (MISCELLANEOUS) ×3
COVER BACK TABLE 60X90IN (DRAPES) ×3 IMPLANT
COVER WAND RF STERILE (DRAPES) ×3 IMPLANT
DECANTER SPIKE VIAL GLASS SM (MISCELLANEOUS) ×3 IMPLANT
DERMABOND ADVANCED (GAUZE/BANDAGES/DRESSINGS) ×2
DERMABOND ADVANCED .7 DNX12 (GAUZE/BANDAGES/DRESSINGS) ×1 IMPLANT
DIFFUSER DRILL AIR PNEUMATIC (MISCELLANEOUS) ×3 IMPLANT
DRAPE C-ARM 42X72 X-RAY (DRAPES) ×12 IMPLANT
DRAPE C-ARMOR (DRAPES) IMPLANT
DRAPE HALF SHEET 40X57 (DRAPES) IMPLANT
DRAPE LAPAROTOMY 100X72X124 (DRAPES) ×3 IMPLANT
DRAPE SURG 17X23 STRL (DRAPES) ×3 IMPLANT
DRILL 5.0/4.0 (BIT) ×3
DRSG OPSITE 4X5.5 SM (GAUZE/BANDAGES/DRESSINGS) ×3 IMPLANT
DRSG OPSITE POSTOP 4X6 (GAUZE/BANDAGES/DRESSINGS) ×3 IMPLANT
DRSG OPSITE POSTOP 4X8 (GAUZE/BANDAGES/DRESSINGS) ×3 IMPLANT
DURAPREP 26ML APPLICATOR (WOUND CARE) ×3 IMPLANT
ELECT REM PT RETURN 9FT ADLT (ELECTROSURGICAL) ×3
ELECTRODE REM PT RTRN 9FT ADLT (ELECTROSURGICAL) ×1 IMPLANT
EVACUATOR 1/8 PVC DRAIN (DRAIN) ×3 IMPLANT
EVACUATOR 3/16  PVC DRAIN (DRAIN)
EVACUATOR 3/16 PVC DRAIN (DRAIN) IMPLANT
GAUZE 4X4 16PLY RFD (DISPOSABLE) ×3 IMPLANT
GAUZE SPONGE 4X4 12PLY STRL (GAUZE/BANDAGES/DRESSINGS) ×3 IMPLANT
GLOVE BIO SURGEON STRL SZ7 (GLOVE) ×3 IMPLANT
GLOVE BIO SURGEON STRL SZ8 (GLOVE) ×6 IMPLANT
GLOVE BIOGEL PI IND STRL 7.0 (GLOVE) ×2 IMPLANT
GLOVE BIOGEL PI INDICATOR 7.0 (GLOVE) ×4
GLOVE EXAM NITRILE XL STR (GLOVE) IMPLANT
GLOVE INDICATOR 8.5 STRL (GLOVE) ×6 IMPLANT
GOWN STRL REUS W/ TWL LRG LVL3 (GOWN DISPOSABLE) ×3 IMPLANT
GOWN STRL REUS W/ TWL XL LVL3 (GOWN DISPOSABLE) ×2 IMPLANT
GOWN STRL REUS W/TWL 2XL LVL3 (GOWN DISPOSABLE) IMPLANT
GOWN STRL REUS W/TWL LRG LVL3 (GOWN DISPOSABLE) ×9
GOWN STRL REUS W/TWL XL LVL3 (GOWN DISPOSABLE) ×6
HEMOSTAT POWDER KIT SURGIFOAM (HEMOSTASIS) IMPLANT
KIT BASIN OR (CUSTOM PROCEDURE TRAY) ×3 IMPLANT
KIT TURNOVER KIT B (KITS) ×3 IMPLANT
MILL MEDIUM DISP (BLADE) IMPLANT
NEEDLE HYPO 21X1.5 SAFETY (NEEDLE) ×3 IMPLANT
NEEDLE HYPO 25X1 1.5 SAFETY (NEEDLE) ×3 IMPLANT
NS IRRIG 1000ML POUR BTL (IV SOLUTION) ×3 IMPLANT
OIL CARTRIDGE MAESTRO DRILL (MISCELLANEOUS) ×3
PACK LAMINECTOMY NEURO (CUSTOM PROCEDURE TRAY) ×3 IMPLANT
PAD ARMBOARD 7.5X6 YLW CONV (MISCELLANEOUS) IMPLANT
PUTTY BONE DBX 5CC MIX (Putty) ×3 IMPLANT
ROD SPINAL 35MM (Rod) ×6 IMPLANT
SHAFT CREO 30MM (Neuro Prosthesis/Implant) ×12 IMPLANT
SPACER SUSTAIN RT 12 8D 9X26 (Spacer) ×6 IMPLANT
SPONGE LAP 4X18 RFD (DISPOSABLE) IMPLANT
SPONGE SURGIFOAM ABS GEL 100 (HEMOSTASIS) ×3 IMPLANT
STRIP CLOSURE SKIN 1/2X4 (GAUZE/BANDAGES/DRESSINGS) ×2 IMPLANT
SUT VIC AB 0 CT1 18XCR BRD8 (SUTURE) ×2 IMPLANT
SUT VIC AB 0 CT1 8-18 (SUTURE) ×6
SUT VIC AB 2-0 CT1 18 (SUTURE) ×3 IMPLANT
SUT VIC AB 4-0 PS2 27 (SUTURE) ×3 IMPLANT
SYR 20ML LL LF (SYRINGE) ×3 IMPLANT
TOWEL GREEN STERILE (TOWEL DISPOSABLE) ×3 IMPLANT
TOWEL GREEN STERILE FF (TOWEL DISPOSABLE) ×3 IMPLANT
TRAY FOLEY MTR SLVR 16FR STAT (SET/KITS/TRAYS/PACK) ×3 IMPLANT
TULIP CREP AMP 5.5MM (Orthopedic Implant) ×12 IMPLANT
WATER STERILE IRR 1000ML POUR (IV SOLUTION) ×3 IMPLANT

## 2020-01-04 NOTE — Anesthesia Preprocedure Evaluation (Addendum)
Anesthesia Evaluation  Patient identified by MRN, date of birth, ID band Patient awake    Reviewed: Allergy & Precautions, NPO status , Patient's Chart, lab work & pertinent test results  Airway Mallampati: II  TM Distance: >3 FB Neck ROM: Full    Dental no notable dental hx. (+) Dental Advisory Given, Chipped,    Pulmonary asthma ,  Albuterol- last use yesterday    Pulmonary exam normal breath sounds clear to auscultation       Cardiovascular hypertension, Normal cardiovascular exam Rhythm:Regular Rate:Normal  Not taking BP meds Does not have PCP or cardiologist   Neuro/Psych TIAnegative psych ROS   GI/Hepatic Neg liver ROS, GERD  Medicated and Controlled,  Endo/Other  Morbid obesityBMI 42  Renal/GU negative Renal ROS  negative genitourinary   Musculoskeletal  (+) Arthritis , Osteoarthritis,  Lumbar spondylolisthesis  Chronic LBP, chronic opiate use   Abdominal (+) + obese,   Peds  Hematology negative hematology ROS (+) hct 42.1   Anesthesia Other Findings ED visits monthly for back pain and sciatica. Has been suspected of opiate misuse in the past. Has not been taking prescribed BP meds- has been hypertensive at every ED visit in the past year  Oxy 15mg  4x/d x many years   Reproductive/Obstetrics negative OB ROS S/p hysterectomy                            Anesthesia Physical Anesthesia Plan  ASA: III  Anesthesia Plan: General   Post-op Pain Management:    Induction: Intravenous  PONV Risk Score and Plan: 4 or greater and Ondansetron, Dexamethasone, Midazolam, Treatment may vary due to age or medical condition and Scopolamine patch - Pre-op  Airway Management Planned: Oral ETT  Additional Equipment: None  Intra-op Plan:   Post-operative Plan: Extubation in OR  Informed Consent: I have reviewed the patients History and Physical, chart, labs and discussed the procedure  including the risks, benefits and alternatives for the proposed anesthesia with the patient or authorized representative who has indicated his/her understanding and acceptance.     Dental advisory given  Plan Discussed with: CRNA  Anesthesia Plan Comments:        Anesthesia Quick Evaluation

## 2020-01-04 NOTE — H&P (Signed)
Gina Cummings is an 54 y.o. female.   Chief Complaint: Back and right greater than left leg pain HPI: 54 year old female with longstanding back and bilateral leg pain worse on the right.  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  Due to patient's failed conservative treatment imaging findings and progression of clinical syndrome I recommended decompression stabilization procedure at L4-5.  I extensively went over the risks and benefits of that procedure with her as well as perioperative course expectations of outcome and alternatives of surgery and she understood and agreed to proceed forward.  Past Medical History:  Diagnosis Date  . Asthma   . Chronic back pain   . Chronic, continuous use of opioids   . Drug-seeking behavior   . Hypertension   . Stroke [facility])    1 time    Past Surgical History:  Procedure Laterality Date  . ABDOMINAL HYSTERECTOMY    . CHOLECYSTECTOMY    . KNEE SURGERY      Family History  Problem Relation Age of Onset  . Cancer Mother   . CAD Father   . CAD Maternal Grandmother    Social History:  reports that she has never smoked. She has never used smokeless tobacco. She reports that she does not drink alcohol and does not use drugs.  Allergies:  Allergies  Allergen Reactions  . Peanut-Containing Drug Products Anaphylaxis  . Shellfish Allergy Anaphylaxis, Swelling and Other (See Comments)    Swelling of tongue  . Penicillins Hives and Rash  . Aspirin     Other reaction(s): Other Does not take due to gastric ulcer History of gastric ulcer   . Gabapentin Other (See Comments)     Tremors, "makes my nerves lock"  . Ibuprofen     Does not take due to gastric ulcer  . Lisinopril Cough    cough   . Morphine And Related Nausea Only  . Nsaids Other (See Comments)    GI Upset Due to gastric ulcers  . Tramadol Nausea And  Vomiting  . Trazodone     Other reaction(s): Other (Comment) Says this caused blood in stool  . Fentanyl Palpitations  . Morphine Other (See Comments), Nausea Only and Rash    Tongue swelling "i dont know why I cant take it"  Stomach pain- felt like having a heart attack     Medications Prior to Admission  Medication Sig Dispense Refill  . ADVAIR DISKUS 100-50 MCG/DOSE AEPB Inhale 1 puff into the lungs in the morning and at bedtime.    . diazepam (VALIUM) 5 MG tablet Take 5 mg by mouth in the morning and at bedtime.    Marland Kitchen oxyCODONE (ROXICODONE) 15 MG immediate release tablet Take 1 tablet (15 mg total) by mouth every 4 (four) hours as needed. (Patient taking differently: Take 15 mg by mouth every 4 (four) hours as needed for pain. ) 2 tablet 0  . albuterol (VENTOLIN HFA) 108 (90 Base) MCG/ACT inhaler Inhale 1-2 puffs into the lungs 4 (four) times daily as needed for wheezing or shortness of breath.    Marland Kitchen amLODipine (NORVASC) 10 MG tablet Take 0.5 tablets (5 mg total) by mouth daily. (Patient not taking: Reported on 12/23/2019) 30 tablet 3  . cloNIDine (CATAPRES) 0.1 MG tablet Take 1 tablet (0.1 mg total) by mouth 2 (two) times daily. (Patient not taking: Reported on 12/23/2019) 60 tablet 1  .  dexamethasone (DECADRON) 4 MG tablet Take 1 tablet (4 mg total) by mouth 2 (two) times daily. (Patient not taking: Reported on 12/23/2019) 8 tablet 0  . diclofenac Sodium (VOLTAREN) 1 % GEL Apply 4 g topically 4 (four) times daily. (Patient not taking: Reported on 12/23/2019) 100 g 0  . famotidine (PEPCID) 20 MG tablet Take 1 tablet (20 mg total) by mouth 2 (two) times daily. (Patient not taking: Reported on 12/23/2019) 30 tablet 0  . hydrochlorothiazide (HYDRODIURIL) 25 MG tablet Take 1 tablet (25 mg total) by mouth daily. (Patient not taking: Reported on 05/18/2019) 30 tablet 1  . HYDROcodone-acetaminophen (NORCO/VICODIN) 5-325 MG tablet Take 1 tablet by mouth every 8 (eight) hours as needed. (Patient not  taking: Reported on 12/23/2019) 15 tablet 0  . lidocaine (LMX) 4 % cream Apply 1 application topically 3 (three) times daily as needed. (Patient not taking: Reported on 12/23/2019) 30 g 0  . methocarbamol (ROBAXIN) 500 MG tablet Take 1 tablet (500 mg total) by mouth every 8 (eight) hours as needed for muscle spasms. (Patient not taking: Reported on 12/23/2019) 21 tablet 0  . methylPREDNISolone (MEDROL DOSEPAK) 4 MG TBPK tablet Take as directed on packet (Patient not taking: Reported on 12/23/2019) 21 tablet 0  . ondansetron (ZOFRAN ODT) 4 MG disintegrating tablet Take 1 tablet (4 mg total) by mouth every 8 (eight) hours as needed for nausea or vomiting. (Patient not taking: Reported on 12/23/2019) 10 tablet 0  . oxyCODONE-acetaminophen (PERCOCET/ROXICET) 5-325 MG tablet Take 1 tablet by mouth every 6 (six) hours as needed for severe pain. (Patient not taking: Reported on 12/23/2019) 10 tablet 0  . predniSONE (DELTASONE) 5 MG tablet Take 6 pills for first day, 5 pills second day, 4 pills third day, 3 pills fourth day, 2 pills the fifth day, and 1 pill sixth day. (Patient not taking: Reported on 12/23/2019) 21 tablet 0  . senna-docusate (SENOKOT-S) 8.6-50 MG tablet Take 1 tablet by mouth at bedtime as needed for mild constipation or moderate constipation. (Patient not taking: Reported on 12/23/2019) 30 tablet 0    Results for orders placed or performed during the hospital encounter of 01/02/20 (from the past 48 hour(s))  Type and screen [facility]     Status: None   Collection Time: 01/02/20  2:17 PM  Result Value Ref Range   ABO/RH(D) B POS    Antibody Screen NEG    Sample Expiration 01/16/2020,2359    Extend sample reason      NO TRANSFUSIONS OR PREGNANCY IN THE PAST 3 MONTHS Performed at [facility], [address] [place], [place] [address]   ABO/Rh     Status: None   Collection Time: 01/02/20  2:17 PM  Result Value Ref Range   ABO/RH(D)      B POS Performed at [facility], [address], [place], [place] [address]   Surgical pcr screen     Status: None   Collection Time: 01/02/20  2:20 PM   Specimen: Nasal Mucosa; Nasal Swab  Result Value Ref Range   MRSA, PCR NEGATIVE NEGATIVE   Staphylococcus aureus NEGATIVE NEGATIVE    Comment: (NOTE) The Xpert SA Assay (FDA approved for NASAL specimens in patients 62 years of age and older), is one component of a comprehensive surveillance program. It is not intended to diagnose infection nor to guide or monitor treatment. Performed at [facility], [address] [place], [place] [address]   Basic  metabolic panel per protocol     Status: None   Collection Time: 01/02/20  2:20 PM  Result Value Ref Range   Sodium 138 135 - 145 mmol/L   Potassium 4.4 3.5 - 5.1 mmol/L   Chloride 107 98 - 111 mmol/L   CO2 22 22 - 32 mmol/L   Glucose, Bld 93 70 - 99 mg/dL    Comment: Glucose reference range applies only to samples taken after fasting for at least 8 hours.   BUN 18 6 - 20 mg/dL   Creatinine, Ser 4.09 0.44 - 1.00 mg/dL   Calcium 9.2 8.9 - 81.1 mg/dL   GFR calc non Af Amer >60 >60 mL/min   GFR calc Af Amer >60 >60 mL/min   Anion gap 9 5 - 15    Comment: Performed at [facility], [address], [place], [place] [address]  CBC per protocol     Status: Abnormal   Collection Time: 01/02/20  2:20 PM  Result Value Ref Range   WBC 3.6 (L) 4.0 - 10.5 K/uL   RBC 4.35 3.87 - 5.11 MIL/uL   Hemoglobin 13.3 12.0 - 15.0 g/dL   HCT 29.5 36 - 46 %   MCV 96.8 80.0 - 100.0 fL   MCH 30.6 26.0 - 34.0 pg   MCHC 31.6 30.0 - 36.0 g/dL   RDW 62.1 30.8 - 65.7 %   Platelets 303 150 - 400 K/uL   nRBC 0.0 0.0 - 0.2 %    Comment: Performed at [facility], [address] [place], [place] [address]   No results found.  Review of Systems  Musculoskeletal: Positive for back pain.  Neurological: Positive for numbness.    Blood pressure (!) 179/86, pulse 73, temperature (!) 97.3 F (36.3 C),  temperature source Temporal, resp. rate 17, height 4\' 11"  (1.499 m), weight 93.9 kg, SpO2 100 %. Physical Exam HENT:     Head: Normocephalic.     Nose: Nose normal.     Mouth/Throat:     Mouth: Mucous membranes are moist.  Eyes:     Pupils: Pupils are equal, round, and reactive to light.  Cardiovascular:     Pulses: Normal pulses.  Musculoskeletal:     Cervical back: Normal range of motion.  Skin:    General: Skin is warm.  Neurological:     General: No focal deficit present.     Comments: Patient is awake alert strength is 5 out of 5 iliopsoas, quads, hamstrings, gastrocs, and tibialis, and EHL.      Assessment/Plan 54 year old female presents for L4-5 posterior lumbar interbody fusion  51, MD 01/04/2020, 7:09 AM

## 2020-01-04 NOTE — Transfer of Care (Signed)
Immediate Anesthesia Transfer of Care Note  Patient: Gina Cummings  Procedure(s) Performed: POSTERIOR LUMBAR INTERBODY FUSION - LUMBAR FOUR-LUMBAR FIVE - INTERBODY FUSION (N/A Back)  Patient Location: PACU  Anesthesia Type:General  Level of Consciousness: awake  Airway & Oxygen Therapy: Patient Spontanous Breathing  Post-op Assessment: Report given to RN and Post -op Vital signs reviewed and stable  Post vital signs: Reviewed and stable  Last Vitals:  Vitals Value Taken Time  BP 195/101 01/04/20 1039  Temp    Pulse 73 01/04/20 1041  Resp 18 01/04/20 1041  SpO2 97 % 01/04/20 1041  Vitals shown include unvalidated device data.  Last Pain:  Vitals:   01/04/20 0637  TempSrc: Temporal  PainSc:       Patients Stated Pain Goal: 3 (01/04/20 1540)  Complications: No complications documented.

## 2020-01-04 NOTE — Anesthesia Procedure Notes (Signed)
Procedure Name: Intubation Performed by: Destyni Hoppel H, CRNA Pre-anesthesia Checklist: Patient identified, Emergency Drugs available, Suction available and Patient being monitored Patient Re-evaluated:Patient Re-evaluated prior to induction Oxygen Delivery Method: Circle System Utilized Preoxygenation: Pre-oxygenation with 100% oxygen Induction Type: IV induction Ventilation: Mask ventilation without difficulty Laryngoscope Size: Miller and 2 Grade View: Grade I Tube type: Oral Tube size: 7.0 mm Number of attempts: 1 Airway Equipment and Method: Stylet and Oral airway Placement Confirmation: ETT inserted through vocal cords under direct vision,  positive ETCO2 and breath sounds checked- equal and bilateral Secured at: 21 cm Tube secured with: Tape Dental Injury: Teeth and Oropharynx as per pre-operative assessment        

## 2020-01-04 NOTE — Op Note (Signed)
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  2.  Posterior lumbar interbody fusion utilizing the globus titanium insert and rotate cages packed with locally harvested autograft mixed with DBX mix  3.  Cortical screw fixation L4-5 utilizing the globus modular cortical screw set  Surgeon: Jillyn Hidden Anzlee Hinesley  Assistant: Julien Girt  Anesthesia: General  EBL: Minimal  HPI: 54 year old female progressive worsening back bilateral hip and leg pain refractory to all forms conservative and over the last 10 years.  Work-up revealed a grade 1 spondylolisthesis severe foraminal stenosis at L4-5 and to the patient's progression of clinical syndrome imaging findings and failed conservative treatment I recommended decompression stabilization procedure at L4-5.  I extensively went over the risks and benefits of that procedure with her as well as perioperative course expectations of outcome and alternatives to surgery and she understood and agreed to proceed forward.  Operative procedure: Patient was brought into the OR was due to general anesthesia positioned prone the Wilson frame her back was prepped and draped in routine sterile fashion utilizing surface landmarks the incision was drawn out and infiltrated with 10 cc lidocaine with epi mode midline incision was made and Bovie electrocautery was used to take down the subcutaneous tissue and subperiosteal dissection was carried lamina of L3-L4 and L5 bilaterally.  Intraoperative x-ray identified initially the L3 pedicle and we had then subsequent identified the L4 pedicle.  The spinous process at L4 was removed central decompression was begun the facet joints and  lamina complex was drilled down laminotomy was begun with a 3 and 4 Miller Kerrison punch complete medial facetectomies were performed there was marked foraminal stenosis from severe hypertrophy of the facet joints overgrowing and projecting superiorly primarily into the L4 nerve roots.  This was all aggressively under Bitton removing and marching all the way out laterally distally out the foramen.  Then aggressive under biting the supra articulating facet gained access lateral margin disc base in both the forward and the 5 root were widely decompressed.  The disc base was then incised and sequentially distracted with an 11 distractor in place complete discectomy with endplate endplate preparation was performed and I selected a 9 mm wide 12 mm lordotic 8 degree cages and and packed them with locally harvested autograft mixed with DBX mix and inserted and rotated then placed under fluoroscopy.  Then an extensive mount of autograft mix was packed centrally contralateral cage was placed in similar fashion after adequate endplate preparation.  Then cortical screws were placed under fluoroscopy all screws had excellent purchase rods were then assembled knots were tightened down at L5 the L4 screw was compressed against L5 all the foramina were reinspected and some additional bone graft was packed laterally to the cages.  And after adequate confirmation of the L4 and L5 nerve roots were widely decompressed Gelfoam was ON top of the dura and the wound medium Hemovac drain was placed and the wound was closed in layers with interrupted Vicryl skin was closed running 4 subcuticular Dermabond benzoin Steri-Strips and a sterile dressing was applied patient recovery in stable condition.  Exparel was injected in the fascia.  At the end the case all needle count sponge counts were correct.

## 2020-01-04 NOTE — Progress Notes (Signed)
Orthopedic Tech Progress Note Patient Details:  Gina Cummings [facility] May 28, 1966 [id number] RN called requesting for a QUIK DRAW BACK BRACE. Called order to [facility]  Patient ID: Gina Cummings, female   DOB: [birth year], 54 y.o.   MRN: [id number]   Gina Cummings 01/04/2020, 1:21 PM

## 2020-01-04 NOTE — Anesthesia Postprocedure Evaluation (Signed)
Anesthesia Post Note  Patient: Gina Cummings  Procedure(s) Performed: POSTERIOR LUMBAR INTERBODY FUSION - LUMBAR FOUR-LUMBAR FIVE - INTERBODY FUSION (N/A Back)     Patient location during evaluation: PACU Anesthesia Type: General Level of consciousness: awake and alert, oriented and patient cooperative Pain management: pain level controlled Vital Signs Assessment: post-procedure vital signs reviewed and stable Respiratory status: spontaneous breathing, nonlabored ventilation and respiratory function stable Cardiovascular status: blood pressure returned to baseline and stable Postop Assessment: no apparent nausea or vomiting Anesthetic complications: no   No complications documented.  Last Vitals:  Vitals:   01/04/20 1123 01/04/20 1139  BP: (!) 173/91 (!) 157/96  Pulse: 68 67  Resp: 20 16  Temp:  37 C  SpO2: 94% 95%    Last Pain:  Vitals:   01/04/20 1139  TempSrc:   PainSc: Asleep                 Lannie Fields

## 2020-01-05 MED FILL — Thrombin For Soln Kit 20000 Unit: CUTANEOUS | Qty: 1 | Status: AC

## 2020-01-05 NOTE — Evaluation (Signed)
Physical Therapy Evaluation Patient Details Name: Gina Cummings MRN: [id number] DOB: Jul 10, 1965 Today's Date: 01/05/2020   History of Present Illness  Pt is a 54 y/o female who presents s/p L4-L5 PLIF on 01/04/2020. PMH significant for CVA, HTN, knee surgery.   Clinical Impression  Pt admitted with above diagnosis. At the time of PT eval, pt was able to demonstrate transfers and ambulation with gross min guard assist to supervision for safety with a RW for support. Pt was educated on precautions, brace application/wearing schedule, appropriate activity progression, and car transfer. Pt currently with functional limitations due to the deficits listed below (see PT Problem List). Pt will benefit from skilled PT to increase their independence and safety with mobility to allow discharge to the venue listed below.      Follow Up Recommendations Home health PT;Supervision for mobility/OOB    Equipment Recommendations  Rolling walker with 5" wheels (youth size)   Recommendations for Other Services       Precautions / Restrictions Precautions Precautions: Fall;Back Precaution Booklet Issued: Yes (comment) Precaution Comments: Reviewed handout in detail and pt was cued for precautions during functional mobility.  Required Braces or Orthoses: Spinal Brace Spinal Brace: Lumbar corset;Applied in sitting position Restrictions Weight Bearing Restrictions: No      Mobility  Bed Mobility Overal bed mobility: Needs Assistance Bed Mobility: Rolling;Sidelying to Sit Rolling: Supervision Sidelying to sit: Min guard       General bed mobility comments: Close guard for safety as pt transitioned to EOB. VC's for log roll technique and HOB lowered to simulate home environment.   Transfers Overall transfer level: Needs assistance Equipment used: Rolling walker (2 wheeled) Transfers: Sit to/from Stand Sit to Stand: Supervision         General transfer comment: VC's for hand placement on  seated surface for safety. No assist required however light supervision provided for safety.   Ambulation/Gait Ambulation/Gait assistance: Min guard Gait Distance (Feet): 100 Feet Assistive device: Rolling walker (2 wheeled) Gait Pattern/deviations: Step-through pattern;Decreased stride length;Trunk flexed Gait velocity: Decreased Gait velocity interpretation: <1.31 ft/sec, indicative of household ambulator General Gait Details: VC's for improved posture, closer walker proximity, and forward gaze.   Stairs            Wheelchair Mobility    Modified Rankin (Stroke Patients Only)       Balance Overall balance assessment: Needs assistance Sitting-balance support: Feet supported;No upper extremity supported Sitting balance-Leahy Scale: Fair     Standing balance support: No upper extremity supported;During functional activity Standing balance-Leahy Scale: Fair Standing balance comment: statically. for dynamic balance activity required UE support.                              Pertinent Vitals/Pain Pain Assessment: 0-10 Pain Score: 10-Worst pain ever Faces Pain Scale: Hurts even more Pain Location: Pt reporting 10/10 pain however faces pain scale 6/10.  Pain Descriptors / Indicators: Operative site guarding;Grimacing Pain Intervention(s): Limited activity within patient's tolerance;Monitored during session;Repositioned;Patient requesting pain meds-RN notified    Home Living Family/patient expects to be discharged to:: Private residence Living Arrangements: Alone Available Help at Discharge: Family;Available PRN/intermittently Type of Home: Apartment Home Access: Stairs to enter   Entrance Stairs-Number of Steps: flight Home Layout: One level Home Equipment: None Additional Comments: Pt reports she has 2 daughters, one who will be assisting initially and returning to [place] next week, and one who lives locally but pt states she  cannot assist her due to  seizures.     Prior Function Level of Independence: Independent         Comments: Reports having difficulty due to the pain but managing PTA     Hand Dominance        Extremity/Trunk Assessment   Upper Extremity Assessment Upper Extremity Assessment: Defer to OT evaluation    Lower Extremity Assessment Lower Extremity Assessment: Generalized weakness    Cervical / Trunk Assessment Cervical / Trunk Assessment: Other exceptions Cervical / Trunk Exceptions: s/p surgery  Communication   Communication: No difficulties  Cognition Arousal/Alertness: Awake/alert Behavior During Therapy: WFL for tasks assessed/performed Overall Cognitive Status: Within Functional Limits for tasks assessed                                        General Comments      Exercises     Assessment/Plan    PT Assessment Patient needs continued PT services  PT Problem List Decreased strength;Decreased activity tolerance;Decreased balance;Decreased mobility;Decreased knowledge of use of DME;Decreased safety awareness;Decreased knowledge of precautions;Pain       PT Treatment Interventions DME instruction;Gait training;Stair training;Functional mobility training;Therapeutic activities;Therapeutic exercise;Neuromuscular re-education;Patient/family education    PT Goals (Current goals can be found in the Care Plan section)  Acute Rehab PT Goals PT Goal Formulation: With patient Time For Goal Achievement: 01/12/20 Potential to Achieve Goals: Good    Frequency Min 5X/week   Barriers to discharge        Co-evaluation               AM-PAC PT "6 Clicks" Mobility  Outcome Measure Help needed turning from your back to your side while in a flat bed without using bedrails?: None Help needed moving from lying on your back to sitting on the side of a flat bed without using bedrails?: A Little Help needed moving to and from a bed to a chair (including a wheelchair)?: A  Little Help needed standing up from a chair using your arms (e.g., wheelchair or bedside chair)?: A Little Help needed to walk in hospital room?: A Little Help needed climbing 3-5 steps with a railing? : A Little 6 Click Score: 19    End of Session Equipment Utilized During Treatment: Gait belt;Back brace Activity Tolerance: Patient limited by pain Patient left: in chair;with call bell/phone within reach Nurse Communication: Mobility status PT Visit Diagnosis: Unsteadiness on feet (R26.81);Pain Pain - part of body:  (back)    Time: 7035-0093 PT Time Calculation (min) (ACUTE ONLY): 24 min   Charges:   PT Evaluation $PT Eval Low Complexity: 1 Low PT Treatments $Gait Training: 8-22 mins        Conni Slipper, PT, DPT Acute Rehabilitation Services Pager: [phone] Office: [phone]   Gina Cummings 01/05/2020, 11:27 AM

## 2020-01-05 NOTE — Progress Notes (Signed)
Subjective: Patient reports Doing better complete resolution of preoperative radicular symptoms still significant back pain does not feel like she can manage herself at home yet  Objective: Vital signs in last 24 hours: Temp:  [98.3 F (36.8 C)-99.1 F (37.3 C)] 99.1 F (37.3 C) (07/01 1157) Pulse Rate:  [70-85] 70 (07/01 1157) Resp:  [16-20] 16 (07/01 1157) BP: (138-190)/(68-92) 149/72 (07/01 1157) SpO2:  [94 %-100 %] 100 % (07/01 1157)  Intake/Output from previous day: 06/30 0701 - 07/01 0700 In: 1890 [P.O.:240; I.V.:1200; IV Piggyback:250] Out: 890 [Urine:230; Drains:260; Blood:400] Intake/Output this shift: No intake/output data recorded.  Strength 5/5 will clean dry and intact  Lab Results: Recent Labs    01/02/20 1420  WBC 3.6*  HGB 13.3  HCT 42.1  PLT 303   BMET Recent Labs    01/02/20 1420  NA 138  K 4.4  CL 107  CO2 22  GLUCOSE 93  BUN 18  CREATININE 0.85  CALCIUM 9.2    Studies/Results: DG Lumbar Spine 2-3 Views  Result Date: 01/04/2020 CLINICAL DATA:  Posterior fusion at L4 and L5 EXAM: LUMBAR SPINE - 2-3 VIEW; DG C-ARM 1-60 MIN COMPARISON:  Lumbar MRI Nov 30, 2019. FLUOROSCOPY TIME:  0 minutes 14 seconds; 41.70 mGy; 2 acquired images FINDINGS: Frontal and lateral views show pedicle screws placed at L4 and L5 with screw tips in the respective vertebral bodies. Disc spacer noted at L4-5. No fracture or spondylolisthesis. Disc spaces appear unremarkable. IMPRESSION: Pedicle screws at L4 and L5 with tips in the respective vertebral bodies. Disc spacer noted at L4-5. No fracture or spondylolisthesis. Electronically Signed   By: Bretta Bang III M.D.   On: 01/04/2020 11:39   DG C-Arm 1-60 Min  Result Date: 01/04/2020 CLINICAL DATA:  Posterior fusion at L4 and L5 EXAM: LUMBAR SPINE - 2-3 VIEW; DG C-ARM 1-60 MIN COMPARISON:  Lumbar MRI Nov 30, 2019. FLUOROSCOPY TIME:  0 minutes 14 seconds; 41.70 mGy; 2 acquired images FINDINGS: Frontal and lateral views  show pedicle screws placed at L4 and L5 with screw tips in the respective vertebral bodies. Disc spacer noted at L4-5. No fracture or spondylolisthesis. Disc spaces appear unremarkable. IMPRESSION: Pedicle screws at L4 and L5 with tips in the respective vertebral bodies. Disc spacer noted at L4-5. No fracture or spondylolisthesis. Electronically Signed   By: Bretta Bang III M.D.   On: 01/04/2020 11:39    Assessment/Plan: Postop day 1 doing very well continue to mobilize with physical and occupational therapy hopeful discharge tomorrow morning  LOS: 0 days     Gina Cummings 01/05/2020, 12:02 PM

## 2020-01-05 NOTE — Evaluation (Signed)
Occupational Therapy Evaluation Patient Details Name: Gina Cummings [facility] MRN: [id number] DOB: 1965/08/22 Today's Date: 01/05/2020    History of Present Illness Pt is a 54 y/o female who presents s/p L4-L5 PLIF on 01/04/2020. PMH significant for CVA, HTN, knee surgery.    Clinical Impression   Pt was independent prior to admission. Presents with pain and mild unsteadiness with ambulation. Pt needs moderate assistance for ADL and moderate assist to return to bed. She ambulated within her room/bathroom with min guard assist, but seeks furniture to stabilize.  Will follow acutely.   Follow Up Recommendations  No OT follow up (Pt's health insurance will not cover OT per CM)    Equipment Recommendations  3 in 1 bedside commode    Recommendations for Other Services       Precautions / Restrictions Precautions Precautions: Fall;Back Precaution Booklet Issued: Yes (comment) Precaution Comments: Reviewed handout in detail and pt was cued for precautions during functional mobility.  Required Braces or Orthoses: Spinal Brace Spinal Brace: Lumbar corset;Applied in sitting position Restrictions Weight Bearing Restrictions: No      Mobility Bed Mobility Overal bed mobility: Needs Assistance Bed Mobility: Rolling;Sit to Sidelying Rolling: Supervision      Sit to sidelying: Mod assist General bed mobility comments: assist for LEs into bed, cues for log roll  Transfers Overall transfer level: Needs assistance Equipment used: None Transfers: Sit to/from Stand Sit to Stand: Supervision         General transfer comment: did not use RW within room, bathroom    Balance Overall balance assessment: Needs assistance Sitting-balance support: Feet supported;No upper extremity supported Sitting balance-Leahy Scale: Good     Standing balance support: No upper extremity supported;During functional activity Standing balance-Leahy Scale: Fair Standing balance comment: statically. for  dynamic balance activity required UE support.                            ADL either performed or assessed with clinical judgement   ADL Overall ADL's : Needs assistance/impaired Eating/Feeding: Independent   Grooming: Supervision/safety;Standing Grooming Details (indicate cue type and reason): educated in two cup method for toothbrushing, use of wash cloth instead of bending over sink Upper Body Bathing: Minimal assistance;Sitting Upper Body Bathing Details (indicate cue type and reason): for back Lower Body Bathing: Moderate assistance;Sit to/from stand   Upper Body Dressing : Set up;Sitting   Lower Body Dressing: Moderate assistance;Sit to/from stand   Toilet Transfer: Min guard;Ambulation   Toileting- Clothing Manipulation and Hygiene: Supervision/safety;Sitting/lateral lean Toileting - Clothing Manipulation Details (indicate cue type and reason): educated pt in avoiding twisting with pericare     Functional mobility during ADLs: Min guard General ADL Comments: Instructed in IADL to avoid.     Vision Patient Visual Report: No change from baseline       Perception     Praxis      Pertinent Vitals/Pain Pain Assessment: Faces Pain Score: 10-Worst pain ever Faces Pain Scale: Hurts even more Pain Location: back Pain Descriptors / Indicators: Operative site guarding;Grimacing Pain Intervention(s): Monitored during session;Repositioned;RN gave pain meds during session     Hand Dominance Right   Extremity/Trunk Assessment Upper Extremity Assessment Upper Extremity Assessment: Overall WFL for tasks assessed   Lower Extremity Assessment Lower Extremity Assessment: Defer to PT evaluation   Cervical / Trunk Assessment Cervical / Trunk Assessment: Other exceptions Cervical / Trunk Exceptions: s/p surgery   Communication Communication Communication: No difficulties   Cognition  Arousal/Alertness: Awake/alert Behavior During Therapy: WFL for tasks  assessed/performed Overall Cognitive Status: Within Functional Limits for tasks assessed                                     General Comments       Exercises     Shoulder Instructions      Home Living Family/patient expects to be discharged to:: Private residence Living Arrangements: Alone Available Help at Discharge: Family;Available PRN/intermittently Type of Home: Apartment Home Access: Stairs to enter Entrance Stairs-Number of Steps: flight   Home Layout: One level     Bathroom Shower/Tub: Chief Strategy Officer: Standard     Home Equipment: None   Additional Comments: Pt reports she has 2 daughters, one who will be assisting initially and returning to [place] next week, and one who lives locally but pt states she cannot assist her due to seizures.       Prior Functioning/Environment Level of Independence: Independent        Comments: Reports having difficulty due to the pain but managing PTA        OT Problem List: Decreased activity tolerance;Impaired balance (sitting and/or standing);Decreased knowledge of use of DME or AE;Obesity;Pain      OT Treatment/Interventions: Self-care/ADL training;DME and/or AE instruction;Balance training;Patient/family education;Therapeutic activities    OT Goals(Current goals can be found in the care plan section) Acute Rehab OT Goals Patient Stated Goal: return home with as much help as she can have OT Goal Formulation: With patient Time For Goal Achievement: 01/19/20 Potential to Achieve Goals: Good ADL Goals Pt Will Perform Grooming: (P) with modified independence;standing Pt Will Perform Lower Body Bathing: (P) with modified independence;with adaptive equipment;sit to/from stand Pt Will Perform Lower Body Dressing: (P) with modified independence;with adaptive equipment;sit to/from stand Pt Will Transfer to Toilet: (P) with modified independence;ambulating;bedside commode Pt Will Perform  Toileting - Clothing Manipulation and hygiene: (P) with modified independence;sit to/from stand Pt Will Perform Tub/Shower Transfer: (P) Tub transfer;with supervision;ambulating;3 in 1 Additional ADL Goal #1: (P) Pt will perform bed mobility modified independently in preparation for ADL.  OT Frequency: Min 2X/week   Barriers to D/C:            Co-evaluation              AM-PAC OT "6 Clicks" Daily Activity     Outcome Measure Help from another person eating meals?: None Help from another person taking care of personal grooming?: A Little Help from another person toileting, which includes using toliet, bedpan, or urinal?: A Little Help from another person bathing (including washing, rinsing, drying)?: A Lot Help from another person to put on and taking off regular upper body clothing?: None Help from another person to put on and taking off regular lower body clothing?: A Lot 6 Click Score: 18   End of Session Equipment Utilized During Treatment: Back brace;Gait belt  Activity Tolerance: Patient limited by pain Patient left: in bed;with call bell/phone within reach  OT Visit Diagnosis: Other abnormalities of gait and mobility (R26.89);Pain                Time: 2585-2778 OT Time Calculation (min): 15 min Charges:  OT General Charges $OT Visit: 1 Visit OT Evaluation $OT Eval Moderate Complexity: 1 Mod  Martie Round, OTR/L Acute Rehabilitation Services Pager: [phone] Office: [phone]  Evern Bio 01/05/2020, 12:58 PM

## 2020-01-05 NOTE — TOC Initial Note (Addendum)
Transition of Care [facility]) - Initial/Assessment Note    Patient Details  Name: Audry Kauzlarich MRN: [id number] Date of Birth: Aug 10, 1965  Transition of Care [facility]) CM/SW Contact:    Mearl Latin, LCSW Phone Number: 01/05/2020, 12:32 PM  Clinical Narrative:                 CSW received consult for possible home health services at time of discharge. CSW spoke with patient regarding PT recommendation of Home Health PT at time of discharge. Patient reported that she would like home health services. Patient reports agreement for [facility]. CSW sent referral for review and they are able to accept for PT only. CSW provided Medicare HH ratings list. CSW confirmed that patient does not have a PCP and address is correct on Facesheet. CSW obtained PCP appointment with [facility] Patient [facility]; info on AVS. No further questions reported at this time. CSW to continue to follow and assist with discharge planning needs.   Expected Discharge Plan: Home w Home Health Services Barriers to Discharge: Continued Medical Work up   Patient Goals and CMS Choice Patient states their goals for this hospitalization and ongoing recovery are:: Return home CMS Medicare.gov Compare Post Acute Care list provided to:: Patient Choice offered to / list presented to : Patient  Expected Discharge Plan and Services Expected Discharge Plan: Home w Home Health Services In-house Referral: Clinical Social Work Discharge Planning Services: CM Consult Post Acute Care Choice: Home Health Living arrangements for the past 2 months: Apartment                           HH Arranged: PT HH Agency: [facility] Date HH Agency Contacted: 01/05/20 Time HH Agency Contacted: 1227 Representative spoke with at [facility] Agency: Lupita Leash  Prior Living Arrangements/Services Living arrangements for the past 2 months: Apartment Lives with:: Self Patient language and need for interpreter reviewed:: Yes Do  you feel safe going back to the place where you live?: Yes      Need for Family Participation in Patient Care: No (Comment) Care giver support system in place?: Yes (comment)   Criminal Activity/Legal Involvement Pertinent to Current Situation/Hospitalization: No - Comment as needed  Activities of Daily Living      Permission Sought/Granted Permission sought to share information with : Facility Industrial/product designer granted to share information with : Yes, Verbal Permission Granted     Permission granted to share info w AGENCY: Home Health        Emotional Assessment Appearance:: Appears stated age Attitude/Demeanor/Rapport: Gracious Affect (typically observed): Accepting, Appropriate Orientation: : Oriented to Self, Oriented to Place, Oriented to  Time, Oriented to Situation Alcohol / Substance Use: Not Applicable Psych Involvement: No (comment)  Admission diagnosis:  Spondylolisthesis at L4-L5 level [M43.16] Patient Active Problem List   Diagnosis Date Noted  . Spondylolisthesis at L4-L5 level 01/04/2020  . OA (osteoarthritis) of knee 10/12/2019   PCP:  Patient, No Pcp Per Pharmacy:   [id number] [place], [place] - [address] [place] [address] Phone: [phone] Fax: [phone]     Social Determinants of Health (SDOH) Interventions    Readmission Risk Interventions No flowsheet data found.

## 2020-01-06 LAB — BASIC METABOLIC PANEL
Anion gap: 9 (ref 5–15)
BUN: 11 mg/dL (ref 6–20)
CO2: 24 mmol/L (ref 22–32)
Calcium: 9 mg/dL (ref 8.9–10.3)
Chloride: 102 mmol/L (ref 98–111)
Creatinine, Ser: 0.86 mg/dL (ref 0.44–1.00)
GFR calc Af Amer: >60 mL/min (ref >60)
GFR calc non Af Amer: >60 mL/min (ref >60)
Glucose, Bld: 100 mg/dL — ABNORMAL HIGH (ref 70–99)
Potassium: 4.5 mmol/L (ref 3.5–5.1)
Sodium: 135 mmol/L (ref 135–145)

## 2020-01-06 MED ORDER — SENNA 8.6 MG PO TABS
1.0000 | ORAL_TABLET | Freq: Two times a day (BID) | ORAL | Status: DC
  Administered 2020-01-07 – 2020-01-09 (×3): 8.6 mg via ORAL
  Filled 2020-01-06 (×4): qty 1

## 2020-01-06 MED ORDER — BISACODYL 10 MG RE SUPP
10.0000 mg | Freq: Every day | RECTAL | Status: DC | PRN
  Administered 2020-01-06: 10 mg via RECTAL
  Filled 2020-01-06: qty 1

## 2020-01-06 MED ORDER — MAGNESIUM CITRATE PO SOLN: 1.0000 | Freq: Once | ORAL | Status: DC

## 2020-01-06 MED ORDER — HYDROXYZINE HCL 50 MG/ML IM SOLN
50.0000 mg | Freq: Four times a day (QID) | INTRAMUSCULAR | Status: DC | PRN
  Administered 2020-01-06: 50 mg via INTRAMUSCULAR
  Filled 2020-01-06 (×2): qty 1

## 2020-01-06 MED ORDER — FLEET ENEMA 7-19 GM/118ML RE ENEM
1.0000 | ENEMA | Freq: Once | RECTAL | Status: AC
  Administered 2020-01-06: 1 via RECTAL
  Filled 2020-01-06: qty 1

## 2020-01-06 NOTE — Progress Notes (Signed)
Occupational Therapy Treatment Patient Details Name: Gina Cummings MRN: [id number] DOB: 1965-07-26 Today's Date: 01/06/2020    History of present illness Pt is a 54 y/o female who presents s/p L4-L5 PLIF on 01/04/2020. PMH significant for CVA, HTN, knee surgery.    OT comments  Pt continues to present with pain impacting pts ability to complete BADLs. Pt reports daughter lives in same apartment complex and cas assist pt intermittently. Full demo provided of all AE for LB ADLs. Overall, pt requires MOD A for LB ADLs, sueprvision- MINA for UB ADLs and minguard - supervision for functional mobility with RW. Issued pt handout of tub shower transfer to 3n1 technique with pt verbalizing understanding. Agree with DC plan below, will follow for OT needs.   Follow Up Recommendations  No OT follow up;Other (comment) (Pt's health insurance will not cover OT per CM)    Equipment Recommendations  3 in 1 bedside commode    Recommendations for Other Services      Precautions / Restrictions Precautions Precautions: Fall;Back Precaution Booklet Issued: Yes (comment) Precaution Comments: pt able to state 3/3 back precautions and demos safe techniques; very receptive to education Required Braces or Orthoses: Spinal Brace Spinal Brace: Lumbar corset;Applied in sitting position Restrictions Weight Bearing Restrictions: No       Mobility Bed Mobility Overal bed mobility: Needs Assistance Bed Mobility: Rolling;Sit to Sidelying;Sidelying to Sit Rolling: Supervision Sidelying to sit: Min guard     Sit to sidelying: Mod assist General bed mobility comments: assist for LEs into bed, cues for log roll. pt reports tall bed at home. educated pt on using stool with grippers to assist with getting into bed  Transfers Overall transfer level: Needs assistance Equipment used: Rolling walker (2 wheeled) Transfers: Sit to/from Stand Sit to Stand: Min guard         General transfer comment: light min  guard to power into standing and for intial steadying assist    Balance Overall balance assessment: Needs assistance Sitting-balance support: Feet supported;No upper extremity supported Sitting balance-Leahy Scale: Good     Standing balance support: No upper extremity supported;During functional activity Standing balance-Leahy Scale: Fair                             ADL either performed or assessed with clinical judgement   ADL Overall ADL's : Needs assistance/impaired     Grooming: Wash/dry hands;Wash/dry face;Oral care;Standing;Supervision/safety;Cueing for safety Grooming Details (indicate cue type and reason): pt demo'ed 2 cup method with good carryover and use of wash cloth as compensatoty method       Lower Body Bathing Details (indicate cue type and reason): pt reports having LH sponge for LB bathing Upper Body Dressing : Minimal assistance;Sitting Upper Body Dressing Details (indicate cue type and reason): to don brace EOB; able to state how to don Lower Body Dressing: Moderate assistance;Sit to/from stand;Adhering to back precautions;Cueing for compensatory techniques;With adaptive equipment Lower Body Dressing Details (indicate cue type and reason): demo'ed use of reacher for LB dressing with pt verbalizing understanding; MOD A to don sock with sock aid. issued pt handout of AE for pt to be able to purchase if needed Toilet Transfer: Min guard;Ambulation;RW;Regular Toilet;Grab bars Toilet Transfer Details (indicate cue type and reason): cues for safety Toileting- Clothing Manipulation and Hygiene: Supervision/safety;Sitting/lateral lean Toileting - Clothing Manipulation Details (indicate cue type and reason): educated pt in avoiding twisting with Psychologist, sport and exercise Details (indicate  cue type and reason): issued pt handout of safe shower transfer technique to 3n1; pt verbalizes understandng but would benefit from practicing Functional mobility during  ADLs: Min guard;Supervision/safety;Rolling walker       Vision       Perception     Praxis      Cognition Arousal/Alertness: Awake/alert Behavior During Therapy: WFL for tasks assessed/performed Overall Cognitive Status: Within Functional Limits for tasks assessed                                          Exercises     Shoulder Instructions       General Comments RN change dressing during session    Pertinent Vitals/ Pain       Pain Assessment: Faces Faces Pain Scale: Hurts little more Pain Location: back Pain Descriptors / Indicators: Operative site guarding;Grimacing Pain Intervention(s): Limited activity within patient's tolerance;Monitored during session;Repositioned  Home Living                                          Prior Functioning/Environment              Frequency  Min 2X/week        Progress Toward Goals  OT Goals(current goals can now be found in the care plan section)  Progress towards OT goals: Progressing toward goals  Acute Rehab OT Goals Patient Stated Goal: return home with as much help as she can have OT Goal Formulation: With patient Time For Goal Achievement: 01/19/20 Potential to Achieve Goals: Good  Plan Discharge plan remains appropriate;Frequency remains appropriate    Co-evaluation                 AM-PAC OT "6 Clicks" Daily Activity     Outcome Measure   Help from another person eating meals?: None Help from another person taking care of personal grooming?: A Little Help from another person toileting, which includes using toliet, bedpan, or urinal?: A Little Help from another person bathing (including washing, rinsing, drying)?: A Lot Help from another person to put on and taking off regular upper body clothing?: None Help from another person to put on and taking off regular lower body clothing?: A Lot 6 Click Score: 18    End of Session Equipment Utilized During  Treatment: Back brace;Gait belt;Rolling walker  OT Visit Diagnosis: Other abnormalities of gait and mobility (R26.89);Pain   Activity Tolerance Patient tolerated treatment well   Patient Left in bed;with call bell/phone within reach   Nurse Communication Mobility status        Time: 9470-9628 OT Time Calculation (min): 37 min  Charges: OT General Charges $OT Visit: 1 Visit OT Treatments $Self Care/Home Management : 23-37 mins Audery Amel., COTA/L Acute Rehabilitation Services [phone]    Angelina Pih 01/06/2020, 8:54 AM

## 2020-01-06 NOTE — Care Management Obs Status (Signed)
MEDICARE OBSERVATION STATUS NOTIFICATION   Patient Details  Name: Gina Cummings MRN: [id number] Date of Birth: 12/16/1965   Medicare Observation Status Notification Given:  Yes    Mearl Latin, LCSW 01/06/2020, 4:53 PM

## 2020-01-06 NOTE — Progress Notes (Signed)
Received patient as transfer from [facility]. Patient alert and oriented x4. VS stable. Dressing on back clean, dry and intact. Call bell within reach. Will continue to monitor.

## 2020-01-06 NOTE — Progress Notes (Signed)
Physical Therapy Treatment Patient Details Name: Gina Cummings MRN: [id number] DOB: 04-Feb-1966 Today's Date: 01/06/2020    History of Present Illness Pt is a 54 y/o female who presents s/p L4-L5 PLIF on 01/04/2020. PMH significant for CVA, HTN, knee surgery.     PT Comments    Pt very limited this session secondary to worsening pain. Pt also reporting that she had a temp of 101 earlier today. Despite significant pain and overall "not feeling well", pt able to ambulate in her room, to and from bathroom with use of RW and min guard for safety. Continue to feel that pt would benefit from further follow-up therapy services via HHPT. Pt would continue to benefit from skilled physical therapy services at this time while admitted and after d/c to address the below listed limitations in order to improve overall safety and independence with functional mobility.    Follow Up Recommendations  Home health PT;Supervision for mobility/OOB     Equipment Recommendations  Rolling walker with 5" wheels    Recommendations for Other Services       Precautions / Restrictions Precautions Precautions: Fall;Back Precaution Comments: reviewed back precautions with pt throughout Required Braces or Orthoses: Spinal Brace Spinal Brace: Lumbar corset;Applied in sitting position Restrictions Weight Bearing Restrictions: No    Mobility  Bed Mobility Overal bed mobility: Needs Assistance Bed Mobility: Rolling;Sit to Sidelying;Sidelying to Sit Rolling: Supervision Sidelying to sit: Min assist     Sit to sidelying: Mod assist General bed mobility comments: increased time and effort, cueing for log roll technique, assistance needed for trunk elevation and to return bilateral LEs onto bed  Transfers Overall transfer level: Needs assistance Equipment used: Rolling walker (2 wheeled) Transfers: Sit to/from Stand Sit to Stand: Min guard         General transfer comment: cueing for safe hand  placement and technique with RW, min guard for safety with transition  Ambulation/Gait Ambulation/Gait assistance: Min guard Gait Distance (Feet): 30 Feet Assistive device: Rolling walker (2 wheeled) Gait Pattern/deviations: Step-through pattern;Decreased stride length;Trunk flexed Gait velocity: Decreased   General Gait Details: pt with ver slow, cautious and guarded movement; very limited secondary to pain this session; no instability or LOB, min guard for safety with use of RW   Stairs             Wheelchair Mobility    Modified Rankin (Stroke Patients Only)       Balance Overall balance assessment: Needs assistance Sitting-balance support: Feet supported;No upper extremity supported Sitting balance-Leahy Scale: Good     Standing balance support: No upper extremity supported;During functional activity Standing balance-Leahy Scale: Fair                              Cognition Arousal/Alertness: Awake/alert Behavior During Therapy: WFL for tasks assessed/performed Overall Cognitive Status: Within Functional Limits for tasks assessed                                        Exercises      General Comments        Pertinent Vitals/Pain Pain Assessment: Faces Faces Pain Scale: Hurts whole lot Pain Location: back and abdomen Pain Descriptors / Indicators: Operative site guarding;Grimacing Pain Intervention(s): Monitored during session;Repositioned    Home Living  Prior Function            PT Goals (current goals can now be found in the care plan section) Acute Rehab PT Goals PT Goal Formulation: With patient Time For Goal Achievement: 01/12/20 Potential to Achieve Goals: Good Progress towards PT goals: Progressing toward goals    Frequency    Min 5X/week      PT Plan Current plan remains appropriate    Co-evaluation              AM-PAC PT "6 Clicks" Mobility   Outcome Measure   Help needed turning from your back to your side while in a flat bed without using bedrails?: None Help needed moving from lying on your back to sitting on the side of a flat bed without using bedrails?: A Little Help needed moving to and from a bed to a chair (including a wheelchair)?: A Little Help needed standing up from a chair using your arms (e.g., wheelchair or bedside chair)?: A Little Help needed to walk in hospital room?: A Little Help needed climbing 3-5 steps with a railing? : A Lot 6 Click Score: 18    End of Session   Activity Tolerance: Patient limited by pain Patient left: in bed;with call bell/phone within reach Nurse Communication: Mobility status PT Visit Diagnosis: Unsteadiness on feet (R26.81);Pain Pain - part of body:  (back and abdomen)     Time: 1209-1222 PT Time Calculation (min) (ACUTE ONLY): 13 min  Charges:  $Gait Training: 8-22 mins                     Arletta Bale, DPT  Acute Rehabilitation Services Pager [phone] Office [phone]     Gina Cummings 01/06/2020, 1:20 PM

## 2020-01-07 DIAGNOSIS — Z8673 Personal history of transient ischemic attack (TIA), and cerebral infarction without residual deficits: Secondary | ICD-10-CM | POA: Diagnosis not present

## 2020-01-07 DIAGNOSIS — Z880 Allergy status to penicillin: Secondary | ICD-10-CM | POA: Diagnosis not present

## 2020-01-07 DIAGNOSIS — Z8249 Family history of ischemic heart disease and other diseases of the circulatory system: Secondary | ICD-10-CM | POA: Diagnosis not present

## 2020-01-07 DIAGNOSIS — J9811 Atelectasis: Secondary | ICD-10-CM | POA: Diagnosis not present

## 2020-01-07 DIAGNOSIS — M4726 Other spondylosis with radiculopathy, lumbar region: Secondary | ICD-10-CM | POA: Diagnosis present

## 2020-01-07 DIAGNOSIS — Z79899 Other long term (current) drug therapy: Secondary | ICD-10-CM | POA: Diagnosis not present

## 2020-01-07 DIAGNOSIS — I10 Essential (primary) hypertension: Secondary | ICD-10-CM | POA: Diagnosis present

## 2020-01-07 DIAGNOSIS — J45909 Unspecified asthma, uncomplicated: Secondary | ICD-10-CM | POA: Diagnosis present

## 2020-01-07 DIAGNOSIS — Z888 Allergy status to other drugs, medicaments and biological substances status: Secondary | ICD-10-CM | POA: Diagnosis not present

## 2020-01-07 DIAGNOSIS — M4316 Spondylolisthesis, lumbar region: Secondary | ICD-10-CM | POA: Diagnosis present

## 2020-01-07 DIAGNOSIS — M48061 Spinal stenosis, lumbar region without neurogenic claudication: Secondary | ICD-10-CM | POA: Diagnosis present

## 2020-01-07 DIAGNOSIS — Z885 Allergy status to narcotic agent status: Secondary | ICD-10-CM | POA: Diagnosis not present

## 2020-01-07 DIAGNOSIS — Z886 Allergy status to analgesic agent status: Secondary | ICD-10-CM | POA: Diagnosis not present

## 2020-01-07 MED ORDER — ENOXAPARIN SODIUM 40 MG/0.4ML ~~LOC~~ SOLN
40.0000 mg | SUBCUTANEOUS | Status: DC
  Administered 2020-01-07 – 2020-01-09 (×3): 40 mg via SUBCUTANEOUS
  Filled 2020-01-07 (×3): qty 0.4

## 2020-01-07 NOTE — TOC Transition Note (Addendum)
Transition of Care [facility]) - CM/SW Discharge Note   Patient Details  Name: Gina Cummings MRN: [id number] Date of Birth: 02/14/1966  [facility] CM/SW Contact:  Deveron Furlong, RN 01/07/2020, 3:49 PM   Clinical Narrative:    Patient to d/c home with [facility] PT.  Arranged with [facility].   Patient needs 3n1 and RW.  These are ordered for delivery to room this afternoon.   Final next level of care: Home w Home Health Services Barriers to Discharge: Continued Medical Work up   Patient Goals and CMS Choice Patient states their goals for this hospitalization and ongoing recovery are:: Return home CMS Medicare.gov Compare Post Acute Care list provided to:: Patient Choice offered to / list presented to : Patient   Discharge Plan and Services In-house Referral: Clinical Social Work Discharge Planning Services: CM Consult Post Acute Care Choice: Home Health            DME Agency: [employer] Date DME Agency Contacted: 01/07/20 Time DME Agency Contacted: [phone] Representative spoke with at DME Agency: Ledell Noss

## 2020-01-07 NOTE — Progress Notes (Signed)
Physical Therapy Treatment Patient Details Name: Gina Cummings MRN: [id number] DOB: 1966-06-24 Today's Date: 01/07/2020    History of Present Illness Pt is a 54 y/o female who presents s/p L4-L5 PLIF on 01/04/2020. PMH significant for CVA, HTN, knee surgery.     PT Comments    Patient is making progress toward PT goals. This session focused on gait and stair training. Pt overall requires supervision-min guard for safety with OOB mobility. Current plan remains appropriate.    Follow Up Recommendations  Home health PT;Supervision for mobility/OOB     Equipment Recommendations  Rolling walker with 5" wheels    Recommendations for Other Services       Precautions / Restrictions Precautions Precautions: Fall;Back Precaution Comments: pt recalls 3/3 precautions Required Braces or Orthoses: Spinal Brace Spinal Brace: Lumbar corset;Applied in sitting position Restrictions Weight Bearing Restrictions: No    Mobility  Bed Mobility Overal bed mobility: Needs Assistance Bed Mobility: Rolling;Sidelying to Sit Rolling: Supervision Sidelying to sit: Supervision       General bed mobility comments: use of rail; increased time and effort  Transfers Overall transfer level: Needs assistance Equipment used: Rolling walker (2 wheeled) Transfers: Sit to/from Stand Sit to Stand: Min guard         General transfer comment: cueing for safe hand placement   Ambulation/Gait Ambulation/Gait assistance: Min guard Gait Distance (Feet): 200 Feet Assistive device: Rolling walker (2 wheeled) Gait Pattern/deviations: Step-through pattern;Decreased stride length Gait velocity: Decreased   General Gait Details: slow, guarded movements but overall steady   Stairs Stairs: Yes Stairs assistance: Min guard Stair Management: One rail Right;Step to pattern;Sideways Number of Stairs: 4 General stair comments: cues for sequencing/technique; min guard for safety; pt reported feeling  anxious and requested to descend after 4 steps   Wheelchair Mobility    Modified Rankin (Stroke Patients Only)       Balance Overall balance assessment: Needs assistance Sitting-balance support: Feet supported;No upper extremity supported Sitting balance-Leahy Scale: Good     Standing balance support: No upper extremity supported;During functional activity Standing balance-Leahy Scale: Fair                              Cognition Arousal/Alertness: Awake/alert Behavior During Therapy: WFL for tasks assessed/performed Overall Cognitive Status: Within Functional Limits for tasks assessed                                        Exercises      General Comments        Pertinent Vitals/Pain Pain Assessment: Faces Faces Pain Scale: Hurts even more Pain Location: back and abdomen Pain Descriptors / Indicators: Operative site guarding;Grimacing Pain Intervention(s): Limited activity within patient's tolerance;Monitored during session;Repositioned;Premedicated before session;Patient requesting pain meds-RN notified    Home Living                      Prior Function            PT Goals (current goals can now be found in the care plan section) Progress towards PT goals: Progressing toward goals    Frequency    Min 5X/week      PT Plan Current plan remains appropriate    Co-evaluation              AM-PAC PT "6 Clicks" Mobility  Outcome Measure  Help needed turning from your back to your side while in a flat bed without using bedrails?: None Help needed moving from lying on your back to sitting on the side of a flat bed without using bedrails?: A Little Help needed moving to and from a bed to a chair (including a wheelchair)?: A Little Help needed standing up from a chair using your arms (e.g., wheelchair or bedside chair)?: A Little Help needed to walk in hospital room?: A Little Help needed climbing 3-5 steps with a  railing? : A Lot 6 Click Score: 18    End of Session Equipment Utilized During Treatment: Gait belt;Back brace Activity Tolerance: Patient tolerated treatment well;Other (comment) (increased pain end of session) Patient left: in bed;with call bell/phone within reach Nurse Communication: Mobility status PT Visit Diagnosis: Unsteadiness on feet (R26.81);Pain Pain - part of body:  (back and abdomen)     Time: 1443-1540 PT Time Calculation (min) (ACUTE ONLY): 31 min  Charges:  $Gait Training: 23-37 mins                     Erline Levine, PTA Acute Rehabilitation Services Pager: [phone] Office: [phone]     Carolynne Edouard 01/07/2020, 11:18 AM

## 2020-01-07 NOTE — Progress Notes (Signed)
Dr. Marcia Brash was made aware that temp was 102.6 F and pt was given tylenol 650mg  PR.

## 2020-01-07 NOTE — Progress Notes (Signed)
Subjective: Patient reports soreness in back Objective: Vital signs in last 24 hours: Temp:  [98.6 F (37 C)-102.6 F (39.2 C)] 101.4 F (38.6 C) (07/03 0622) Pulse Rate:  [80-96] 91 (07/03 0622) Resp:  [15-18] 16 (07/03 0622) BP: (113-168)/(65-92) 166/92 (07/03 0622) SpO2:  [96 %-100 %] 100 % (07/03 0905)  Intake/Output from previous day: 07/02 0701 - 07/03 0700 In: 63 [P.O.:60; I.V.:3] Out: -  Intake/Output this shift: Total I/O In: 300 [P.O.:300] Out: -   Awake, alert, Ox3 Dressing c/d LSO brace in place Pain limited strength in LEs, but full neurologic strength  Lab Results: No results for input(s): WBC, HGB, HCT, PLT in the last 72 hours. BMET Recent Labs    01/06/20 0838  NA 135  K 4.5  CL 102  CO2 24  GLUCOSE 100*  BUN 11  CREATININE 0.86  CALCIUM 9.0    Studies/Results: No results found.  Assessment/Plan: S/p lumbar decompression and PLIFs for stenosis with spondylolisthesis - likely home once home health set up  Bedelia Person 01/07/2020, 11:42 AM

## 2020-01-07 NOTE — Plan of Care (Signed)
  Problem: Activity: Goal: Ability to avoid complications of mobility impairment will improve Outcome: Progressing   Problem: Clinical Measurements: Goal: Ability to maintain clinical measurements within normal limits will improve Outcome: Progressing   Problem: Pain Management: Goal: Pain level will decrease Outcome: Progressing

## 2020-01-07 NOTE — Plan of Care (Signed)

## 2020-01-07 NOTE — Progress Notes (Signed)
Occupational Therapy Treatment Patient Details Name: Gina Cummings MRN: [id number] DOB: 25-Feb-1966 Today's Date: 01/07/2020    History of present illness Pt is a 54 y/o female who presents s/p L4-L5 PLIF on 01/04/2020. PMH significant for CVA, HTN, knee surgery.    OT comments  Pt with gradual progress towards OT goals. She continues to have some limitations due to increased pain levels, and pt having intermittent spasms during this session. Pt tolerating room level mobility using RW, toileting and standing grooming ADL with minguard - minA throughout. Further reviewed use of AE for LB ADL with pt able to recall and return demonstrate understanding from previous OT session with min cues throughout. Pt will benefit from continued acute OT services to maximize her safety and independence with ADL and mobility prior to d/c home.   Follow Up Recommendations  No OT follow up;Other (comment) (Pt's health insurance won't cover OT per CM)    Equipment Recommendations  3 in 1 bedside commode          Precautions / Restrictions Precautions Precautions: Fall;Back Precaution Comments: pt recalls 3/3 precautions Required Braces or Orthoses: Spinal Brace Spinal Brace: Lumbar corset;Applied in sitting position Restrictions Weight Bearing Restrictions: No       Mobility Bed Mobility Overal bed mobility: Needs Assistance Bed Mobility: Rolling;Sidelying to Sit;Sit to Sidelying Rolling: Supervision Sidelying to sit: Min guard     Sit to sidelying: Mod assist General bed mobility comments: assist for LEs onto EOB  Transfers Overall transfer level: Needs assistance Equipment used: Rolling walker (2 wheeled) Transfers: Sit to/from Stand Sit to Stand: Min guard         General transfer comment: cueing for safe hand placement ; stood from EOB and toilet     Balance Overall balance assessment: Needs assistance Sitting-balance support: Feet supported;No upper extremity  supported Sitting balance-Leahy Scale: Good     Standing balance support: No upper extremity supported;During functional activity Standing balance-Leahy Scale: Fair                             ADL either performed or assessed with clinical judgement   ADL Overall ADL's : Needs assistance/impaired     Grooming: Wash/dry hands;Min guard;Standing               Lower Body Dressing: Minimal assistance;Sit to/from stand;With adaptive equipment Lower Body Dressing Details (indicate cue type and reason): pt return demonstrating use of sock aide and reacher with min cues for recall from previous session  Toilet Transfer: Min guard;Minimal assistance;Ambulation;RW;Grab bars Toilet Transfer Details (indicate cue type and reason): close minguard-minA as pt with increased pain/spasms this session Toileting- Clothing Manipulation and Hygiene: Supervision/safety;Sitting/lateral lean;Sit to/from stand Toileting - Clothing Manipulation Details (indicate cue type and reason): performing atnerior pericare while seated    Tub/Shower Transfer Details (indicate cue type and reason): discussed possible/likely need to sponge bathe initially after return home given ongoing/increased pain levels. pt verbalizing agreement  Functional mobility during ADLs: Min guard;Minimal assistance;Rolling walker                         Cognition Arousal/Alertness: Awake/alert Behavior During Therapy: WFL for tasks assessed/performed Overall Cognitive Status: Within Functional Limits for tasks assessed  Exercises     Shoulder Instructions       General Comments      Pertinent Vitals/ Pain       Pain Assessment: Faces Faces Pain Scale: Hurts whole lot Pain Location: back and abdomen, cramping/spasms at buttocks Pain Descriptors / Indicators: Operative site guarding;Grimacing;Crying Pain Intervention(s): Limited activity within  patient's tolerance;Monitored during session;RN gave pain meds during session;Repositioned  Home Living                                          Prior Functioning/Environment              Frequency  Min 2X/week        Progress Toward Goals  OT Goals(current goals can now be found in the care plan section)  Progress towards OT goals: Progressing toward goals  Acute Rehab OT Goals Patient Stated Goal: return home with as much help as she can have OT Goal Formulation: With patient Time For Goal Achievement: 01/19/20 Potential to Achieve Goals: Good ADL Goals Pt Will Perform Grooming: with modified independence;standing Pt Will Perform Lower Body Bathing: with modified independence;with adaptive equipment;sit to/from stand Pt Will Perform Lower Body Dressing: with modified independence;with adaptive equipment;sit to/from stand Pt Will Transfer to Toilet: with modified independence;ambulating;bedside commode Pt Will Perform Toileting - Clothing Manipulation and hygiene: with modified independence;sit to/from stand Pt Will Perform Tub/Shower Transfer: Tub transfer;with supervision;ambulating;3 in 1 Additional ADL Goal #1: Pt will perform bed mobility modified independently in preparation for ADL.  Plan Discharge plan remains appropriate;Frequency remains appropriate    Co-evaluation                 AM-PAC OT "6 Clicks" Daily Activity     Outcome Measure   Help from another person eating meals?: None Help from another person taking care of personal grooming?: A Little Help from another person toileting, which includes using toliet, bedpan, or urinal?: A Little Help from another person bathing (including washing, rinsing, drying)?: A Lot Help from another person to put on and taking off regular upper body clothing?: A Little Help from another person to put on and taking off regular lower body clothing?: A Lot 6 Click Score: 17    End of Session  Equipment Utilized During Treatment: Rolling walker;Gait belt  OT Visit Diagnosis: Other abnormalities of gait and mobility (R26.89);Pain Pain - part of body:  (back)   Activity Tolerance Patient tolerated treatment well;Patient limited by pain   Patient Left in bed;with call bell/phone within reach;with bed alarm set   Nurse Communication Mobility status        Time: 3612-2449 OT Time Calculation (min): 26 min  Charges: OT General Charges $OT Visit: 1 Visit OT Treatments $Self Care/Home Management : 23-37 mins  Marcy Siren, OT Acute Rehabilitation Services Pager [phone] Office [phone]   Gina Cummings 01/07/2020, 4:36 PM

## 2020-01-08 LAB — URINALYSIS, ROUTINE W REFLEX MICROSCOPIC
Bilirubin Urine: NEGATIVE
Glucose, UA: NEGATIVE mg/dL
Ketones, ur: NEGATIVE mg/dL
Leukocytes,Ua: NEGATIVE
Nitrite: NEGATIVE
Protein, ur: NEGATIVE mg/dL
Specific Gravity, Urine: 1.014 (ref 1.005–1.030)
pH: 5 (ref 5.0–8.0)

## 2020-01-08 NOTE — Progress Notes (Signed)
Physical Therapy Treatment Patient Details Name: Gina Cummings MRN: [id number] DOB: March 16, 1966 Today's Date: 01/08/2020    History of Present Illness Pt is a 54 y/o female who presents s/p L4-L5 PLIF on 01/04/2020. PMH significant for CVA, HTN, knee surgery.     PT Comments    Pt progressing steadily towards her physical therapy goals. Continues with back spasms, agreeable for mobility. Ambulating 250 feet with a walker at a supervision level. Negotiated 6 rails with a right railing utilizing a sideways technique. Education provided regarding generalized walking program, brace use, and car transfer. Pt adequate for d/c home.     Follow Up Recommendations  Home health PT;Supervision for mobility/OOB     Equipment Recommendations  Rolling walker with 5" wheels    Recommendations for Other Services       Precautions / Restrictions Precautions Precautions: Fall;Back Required Braces or Orthoses: Spinal Brace Spinal Brace: Lumbar corset;Applied in sitting position Restrictions Weight Bearing Restrictions: No    Mobility  Bed Mobility Overal bed mobility: Needs Assistance Bed Mobility: Rolling;Sidelying to Sit;Sit to Sidelying Rolling: Supervision Sidelying to sit: Supervision     Sit to sidelying: Min assist General bed mobility comments: MinA for LE negotiation back into bed  Transfers Overall transfer level: Needs assistance Equipment used: Rolling walker (2 wheeled) Transfers: Sit to/from Stand Sit to Stand: Min guard            Ambulation/Gait Ambulation/Gait assistance: Supervision Gait Distance (Feet): 250 Feet Assistive device: Rolling walker (2 wheeled) Gait Pattern/deviations: Step-through pattern;Decreased stride length Gait velocity: Decreased   General Gait Details: Slow, guarded movement, min cues for walker proximity. No gross unsteadiness   Stairs Stairs: Yes Stairs assistance: Min guard Stair Management: One rail Right;Step to  pattern;Sideways Number of Stairs: 6 General stair comments: Cues for sequencing technique, step by step pattern negotiating sideways.   Wheelchair Mobility    Modified Rankin (Stroke Patients Only)       Balance Overall balance assessment: Needs assistance Sitting-balance support: Feet supported;No upper extremity supported Sitting balance-Leahy Scale: Good     Standing balance support: No upper extremity supported;During functional activity Standing balance-Leahy Scale: Fair                              Cognition Arousal/Alertness: Awake/alert Behavior During Therapy: WFL for tasks assessed/performed Overall Cognitive Status: Within Functional Limits for tasks assessed                                        Exercises      General Comments        Pertinent Vitals/Pain Pain Assessment: Faces Pain Location: back  Pain Descriptors / Indicators: Spasm Pain Intervention(s): Monitored during session    Home Living                      Prior Function            PT Goals (current goals can now be found in the care plan section) Acute Rehab PT Goals Patient Stated Goal: return home Potential to Achieve Goals: Good Progress towards PT goals: Progressing toward goals    Frequency    Min 5X/week      PT Plan Current plan remains appropriate    Co-evaluation  AM-PAC PT "6 Clicks" Mobility   Outcome Measure  Help needed turning from your back to your side while in a flat bed without using bedrails?: None Help needed moving from lying on your back to sitting on the side of a flat bed without using bedrails?: A Little Help needed moving to and from a bed to a chair (including a wheelchair)?: A Little Help needed standing up from a chair using your arms (e.g., wheelchair or bedside chair)?: A Little Help needed to walk in hospital room?: None Help needed climbing 3-5 steps with a railing? : A Little 6  Click Score: 20    End of Session Equipment Utilized During Treatment: Gait belt;Back brace Activity Tolerance: Patient tolerated treatment well Patient left: in bed;with call bell/phone within reach Nurse Communication: Mobility status PT Visit Diagnosis: Unsteadiness on feet (R26.81);Pain Pain - part of body:  (back and abdomen)     Time: 2458-0998 PT Time Calculation (min) (ACUTE ONLY): 28 min  Charges:  $Gait Training: 23-37 mins                       Lillia Pauls, PT, DPT Acute Rehabilitation Services Pager [phone] Office [phone]    Norval Morton 01/08/2020, 4:06 PM

## 2020-01-08 NOTE — Plan of Care (Signed)
  Problem: Education: Goal: Understanding of discharge needs will improve Outcome: Progressing   Problem: Activity: Goal: Will remain free from falls Outcome: Progressing   Problem: Clinical Measurements: Goal: Postoperative complications will be avoided or minimized Outcome: Progressing   Problem: Pain Management: Goal: Pain level will decrease Outcome: Progressing   Problem: Skin Integrity: Goal: Will show signs of wound healing Outcome: Progressing

## 2020-01-08 NOTE — Progress Notes (Signed)
Subjective: Patient reports she had some back spasms.  Febrile this morning, currently afebrile.   Objective: Vital signs in last 24 hours: Temp:  [98.6 F (37 C)-101.3 F (38.5 C)] 101.3 F (38.5 C) (07/04 0547) Pulse Rate:  [84-92] 92 (07/04 0547) Resp:  [16-18] 18 (07/04 0547) BP: (146-158)/(65-79) 158/79 (07/04 0547) SpO2:  [95 %-100 %] 99 % (07/04 0849)  Intake/Output from previous day: 07/03 0701 - 07/04 0700 In: 1080 [P.O.:1080] Out: -  Intake/Output this shift: Total I/O In: 200 [P.O.:200] Out: -  NAD Incision c/d, no drainage Full strength in LEs   Lab Results: No results for input(s): WBC, HGB, HCT, PLT in the last 72 hours. BMET Recent Labs    01/06/20 0838  NA 135  K 4.5  CL 102  CO2 24  GLUCOSE 100*  BUN 11  CREATININE 0.86  CALCIUM 9.0    Studies/Results: No results found.  Assessment/Plan: S/p lumbar fusion - likely dc with home health tomorrow - has had early morning fevers the past two days, likely from atelectasis developing while sleeping. No SOB, leg swelling, urinary symptoms -- will check UA/urine culture - encourage IS   Bedelia Person 01/08/2020, 11:31 AM

## 2020-01-09 MED ORDER — SENNA 8.6 MG PO TABS: 1.0000 | ORAL_TABLET | Freq: Two times a day (BID) | ORAL | 2 refills | Status: AC

## 2020-01-09 MED ORDER — OXYCODONE-ACETAMINOPHEN 10-325 MG PO TABS: 1.0000 | ORAL_TABLET | Freq: Four times a day (QID) | ORAL | 0 refills | Status: DC | PRN

## 2020-01-09 MED ORDER — HYDROMORPHONE HCL 1 MG/ML IJ SOLN
0.7500 mg | INTRAMUSCULAR | Status: DC | PRN
  Administered 2020-01-09: 0.75 mg via INTRAVENOUS
  Filled 2020-01-09: qty 1

## 2020-01-09 MED ORDER — METHOCARBAMOL 500 MG PO TABS: 500.0000 mg | ORAL_TABLET | Freq: Four times a day (QID) | ORAL | 0 refills | Status: DC

## 2020-01-09 MED ORDER — DIAZEPAM 5 MG PO TABS: 5.0000 mg | ORAL_TABLET | Freq: Four times a day (QID) | ORAL | 0 refills | Status: DC | PRN

## 2020-01-09 NOTE — Plan of Care (Signed)
  Problem: Safety: Goal: Ability to remain free from injury will improve Outcome: Progressing   Problem: Education: Goal: Knowledge of the prescribed therapeutic regimen will improve Outcome: Progressing   Problem: Clinical Measurements: Goal: Ability to maintain clinical measurements within normal limits will improve Outcome: Progressing   Problem: Pain Management: Goal: Pain level will decrease Outcome: Progressing   Problem: Skin Integrity: Goal: Will show signs of wound healing Outcome: Progressing

## 2020-01-09 NOTE — Care Management (Signed)
Clydie Braun with [facility] aware discharge is today. Patient has DME in room.   Ronny Flurry RN

## 2020-01-09 NOTE — Plan of Care (Signed)
  Problem: Safety: Goal: Ability to remain free from injury will improve Outcome: Progressing   

## 2020-01-09 NOTE — Progress Notes (Signed)
Occupational Therapy Treatment Patient Details Name: Gina Cummings [facility] MRN: [id number] DOB: [birth year] Today's Date: 01/09/2020    History of present illness Pt is a 54 y/o female who presents s/p L4-L5 PLIF on 01/04/2020. PMH significant for CVA, HTN, knee surgery.    OT comments  Pt progressing well. Pt performing ADL dressing routine and education for AE provided with supervisionA Pt abiding by back precautions and able to state them. Pt reports that she plans to wear comfortable clothes. Pt ambulating 200' with supervisionA, slow gait and back spasms present. Pt would benefit from continued OT skilled services. OT following acutely.     Follow Up Recommendations  No OT follow up;Other (comment)    Equipment Recommendations  3 in 1 bedside commode    Recommendations for Other Services      Precautions / Restrictions Precautions Precautions: Fall;Back Required Braces or Orthoses: Spinal Brace Spinal Brace: Lumbar corset;Applied in sitting position Restrictions Weight Bearing Restrictions: No       Mobility Bed Mobility Overal bed mobility: Needs Assistance     Sidelying to sit: Supervision       General bed mobility comments: no physical assist; log roll technique performed  Transfers Overall transfer level: Needs assistance Equipment used: Rolling walker (2 wheeled) Transfers: Sit to/from Stand Sit to Stand: Min guard              Balance Overall balance assessment: Needs assistance Sitting-balance support: Feet supported;No upper extremity supported Sitting balance-Leahy Scale: Good     Standing balance support: No upper extremity supported;During functional activity Standing balance-Leahy Scale: Fair                             ADL either performed or assessed with clinical judgement   ADL Overall ADL's : Needs assistance/impaired Eating/Feeding: Independent               Upper Body Dressing : Set up;Sitting   Lower Body  Dressing: Set up;Sit to/from stand;Sitting/lateral leans               Functional mobility during ADLs: Supervision/safety;Rolling walker General ADL Comments: Pt performing ADL dressing routine and education for AE provided. Pt reports that she plans to wear comfortable clothes.     Vision   Vision Assessment?: No apparent visual deficits   Perception     Praxis      Cognition Arousal/Alertness: Awake/alert Behavior During Therapy: WFL for tasks assessed/performed Overall Cognitive Status: Within Functional Limits for tasks assessed                                          Exercises     Shoulder Instructions       General Comments Pt ambulating 200' with RW. Pt getting ready for d/c and plans to have assist from family.    Pertinent Vitals/ Pain       Pain Assessment: Faces Faces Pain Scale: Hurts little more Pain Location: back  Pain Descriptors / Indicators: Spasm Pain Intervention(s): Monitored during session  Home Living                                          Prior Functioning/Environment  Frequency  Min 2X/week        Progress Toward Goals  OT Goals(current goals can now be found in the care plan section)  Progress towards OT goals: Progressing toward goals  Acute Rehab OT Goals Patient Stated Goal: return home OT Goal Formulation: With patient Time For Goal Achievement: 01/19/20 Potential to Achieve Goals: Good ADL Goals Pt Will Perform Grooming: with modified independence;standing Pt Will Perform Lower Body Bathing: with modified independence;with adaptive equipment;sit to/from stand Pt Will Perform Lower Body Dressing: with modified independence;with adaptive equipment;sit to/from stand Pt Will Transfer to Toilet: with modified independence;ambulating;bedside commode Pt Will Perform Toileting - Clothing Manipulation and hygiene: with modified independence;sit to/from stand Pt Will  Perform Tub/Shower Transfer: Tub transfer;with supervision;ambulating;3 in 1 Additional ADL Goal #1: Pt will perform bed mobility modified independently in preparation for ADL.  Plan Discharge plan remains appropriate;Frequency remains appropriate    Co-evaluation                 AM-PAC OT "6 Clicks" Daily Activity     Outcome Measure   Help from another person eating meals?: None Help from another person taking care of personal grooming?: A Little Help from another person toileting, which includes using toliet, bedpan, or urinal?: A Little Help from another person bathing (including washing, rinsing, drying)?: A Little Help from another person to put on and taking off regular upper body clothing?: A Little Help from another person to put on and taking off regular lower body clothing?: A Little 6 Click Score: 19    End of Session Equipment Utilized During Treatment: Rolling walker;Gait belt  OT Visit Diagnosis: Other abnormalities of gait and mobility (R26.89);Pain Pain - part of body:  (back)   Activity Tolerance Patient tolerated treatment well;Patient limited by pain   Patient Left in bed;with call bell/phone within reach;with bed alarm set   Nurse Communication Mobility status        Time: 3704-8889 OT Time Calculation (min): 34 min  Charges: OT General Charges $OT Visit: 1 Visit OT Treatments $Self Care/Home Management : 8-22 mins $Therapeutic Activity: 8-22 mins  Flora Lipps, OTR/L Acute Rehabilitation Services Pager: [phone] Office: [phone]    Hiyab Nhem C 01/09/2020, 2:55 PM

## 2020-01-09 NOTE — Discharge Instructions (Addendum)
Can shower, steri-strips will fall off on their own Walk as much as possible No heavy lifting >10 lbs No excessive bending/twisting at the waist Use incentive spirometer every hour while awake

## 2020-01-09 NOTE — Progress Notes (Signed)
AVS given and reviewed with pt. Medications discussed and signed printed prescriptions provided to pt. 3in1 and rolling walker at bedside. All questions answered to satisfaction. Pt verbalized understanding of information given. Pt escorted off the unit with all belongings via wheelchair by staff member.

## 2020-01-09 NOTE — Progress Notes (Signed)
At 2006 pt was medicated for pain and stated that "around midnight or so I will want my Dilaudid and my valium so I can rest tonight". I informed her that this would be acceptable but that the medication was not scheduled; it was PRN and she would need to call out to have her pain level assessed to receiver her medication. She verbalized understanding. Pt has been rounded on numerous times since then by both myself and [employer] with no complaints of pain or needs voiced. Pt is currently sleeping soundly, respirations even and unlabored with no s/s of pain or distress noted. 01/09/2020 0116 Manson Allan, RN

## 2020-01-09 NOTE — Discharge Summary (Signed)
Physician Discharge Summary  Patient ID: Gina Cummings [facility] MRN: [id number] DOB/AGE: [birth year] 54 y.o.  Admit date: 01/04/2020 Discharge date: 01/09/2020  Admission Diagnoses:  Spondylolisthesis at L4-L5 level  Discharge Diagnoses:  Same Active Problems:   Spondylolisthesis at L4-L5 level   Discharged Condition: Stable  Hospital Course:  Gina Cummings is a 54 y.o. female  Who underwent L4-5 PLIF on 01/04/20.  She was admitted to the hospital postoperatively.  She was mobilized with PT/OT.  She was voiding without difficulty.  Given her pain which was limiting her mobility, home health services with PT was set up for the patient prior to discharge.  Her pain was controlled with PO pain medications.  She did have atelectatic fevers in the morning but her urinalysis was clean and she had no SOB despite hx of asthma, no leg swelling and her incision was clean.  She was deemed ready for discharge home on 01/09/20.  Treatments: Surgery - L4-5 PLIF  Discharge Exam: Blood pressure (!) 138/59, pulse 93, temperature 98.7 F (37.1 C), resp. rate 17, height 4\' 11"  (1.499 m), weight 93.9 kg, SpO2 98 %. Awake, alert, oriented Speech fluent, appropriate CN grossly intact 5/5 BUE/BLE Wound c/d/i  Disposition: Discharge disposition: 06-Home-Health Care Svc        Allergies as of 01/09/2020      Reactions   Peanut-containing Drug Products Anaphylaxis   Shellfish Allergy Anaphylaxis, Swelling, Other (See Comments)   Swelling of tongue   Penicillins Hives, Rash   Aspirin    Other reaction(s): Other Does not take due to gastric ulcer History of gastric ulcer   Gabapentin Other (See Comments)   Tremors, "makes my nerves lock"   Ibuprofen    Does not take due to gastric ulcer   Lisinopril Cough   cough   Morphine And Related Nausea Only   Nsaids Other (See Comments)   GI Upset Due to gastric ulcers   Tramadol Nausea And Vomiting   Trazodone    Other reaction(s): Other  (Comment) Says this caused blood in stool   Fentanyl Palpitations   Morphine Other (See Comments), Nausea Only, Rash   Tongue swelling "i dont know why I cant take it"  Stomach pain- felt like having a heart attack      Medication List    STOP taking these medications   dexamethasone 4 MG tablet Commonly known as: DECADRON   diclofenac Sodium 1 % Gel Commonly known as: VOLTAREN   HYDROcodone-acetaminophen 5-325 MG tablet Commonly known as: NORCO/VICODIN   lidocaine 4 % cream Commonly known as: LMX   methylPREDNISolone 4 MG Tbpk tablet Commonly known as: MEDROL DOSEPAK   oxyCODONE 15 MG immediate release tablet Commonly known as: ROXICODONE   oxyCODONE-acetaminophen 5-325 MG tablet Commonly known as: PERCOCET/ROXICET Replaced by: oxyCODONE-acetaminophen 10-325 MG tablet   predniSONE 5 MG tablet Commonly known as: DELTASONE     TAKE these medications   Advair Diskus 100-50 MCG/DOSE Aepb Generic drug: Fluticasone-Salmeterol Inhale 1 puff into the lungs in the morning and at bedtime.   albuterol 108 (90 Base) MCG/ACT inhaler Commonly known as: VENTOLIN HFA Inhale 1-2 puffs into the lungs 4 (four) times daily as needed for wheezing or shortness of breath.   amLODipine 10 MG tablet Commonly known as: NORVASC Take 0.5 tablets (5 mg total) by mouth daily.   cloNIDine 0.1 MG tablet Commonly known as: CATAPRES Take 1 tablet (0.1 mg total) by mouth 2 (two) times daily.   diazepam 5 MG tablet Commonly  known as: VALIUM Take 1 tablet (5 mg total) by mouth every 6 (six) hours as needed for muscle spasms. What changed:   when to take this  reasons to take this   famotidine 20 MG tablet Commonly known as: PEPCID Take 1 tablet (20 mg total) by mouth 2 (two) times daily.   hydrochlorothiazide 25 MG tablet Commonly known as: HYDRODIURIL Take 1 tablet (25 mg total) by mouth daily.   methocarbamol 500 MG tablet Commonly known as: Robaxin Take 1 tablet (500 mg  total) by mouth 4 (four) times daily. What changed:   when to take this  reasons to take this   ondansetron 4 MG disintegrating tablet Commonly known as: Zofran ODT Take 1 tablet (4 mg total) by mouth every 8 (eight) hours as needed for nausea or vomiting.   oxyCODONE-acetaminophen 10-325 MG tablet Commonly known as: Percocet Take 1 tablet by mouth every 6 (six) hours as needed for up to 21 days for pain. Replaces: oxyCODONE-acetaminophen 5-325 MG tablet   senna 8.6 MG Tabs tablet Commonly known as: SENOKOT Take 1 tablet (8.6 mg total) by mouth 2 (two) times daily.   senna-docusate 8.6-50 MG tablet Commonly known as: Senokot-S Take 1 tablet by mouth at bedtime as needed for mild constipation or moderate constipation.            Durable Medical Equipment  (From admission, onward)         Start     Ordered   01/07/20 1554  For home use only DME 3 n 1  Once        01/07/20 1553   01/07/20 1554  For home use only DME Walker rolling  Once       Question Answer Comment  Walker: With 5 Inch Wheels   Patient needs a walker to treat with the following condition Status post lumbar surgery      01/07/20 1553          Follow-up Information    [facility]. Go on 01/27/2020.   Specialty: Internal Medicine Why: PCP appointment at 9:20am with [facility] information: [address] [place] [address] [phone]       [facility] Follow up.   Specialty: Home Health Services Why: [phone].  This agency will contact you to arrange PT visit.       Donalee Citrin, MD. Schedule an appointment as soon as possible for a visit in 10 day(s).   Specialty: Neurosurgery Contact information: [address] [place] [address] [phone]               Signed: Bedelia Person 01/09/2020, 10:49 AM

## 2020-01-10 LAB — URINE CULTURE

## 2020-01-22 ENCOUNTER — Emergency Department (HOSPITAL_BASED_OUTPATIENT_CLINIC_OR_DEPARTMENT_OTHER)
Admission: EM | Admit: 2020-01-22 | Discharge: 2020-01-22 | Disposition: A | Discharge status: Home / Self Care | Payer: Medicare HMO | Attending: Emergency Medicine | Admitting: Emergency Medicine

## 2020-01-22 ENCOUNTER — Emergency Department (HOSPITAL_BASED_OUTPATIENT_CLINIC_OR_DEPARTMENT_OTHER): Payer: Medicare HMO

## 2020-01-22 ENCOUNTER — Other Ambulatory Visit: Payer: Self-pay

## 2020-01-22 DIAGNOSIS — M545 Low back pain, unspecified: Secondary | ICD-10-CM

## 2020-01-22 DIAGNOSIS — Z79899 Other long term (current) drug therapy: Secondary | ICD-10-CM | POA: Insufficient documentation

## 2020-01-22 DIAGNOSIS — J45909 Unspecified asthma, uncomplicated: Secondary | ICD-10-CM | POA: Diagnosis not present

## 2020-01-22 DIAGNOSIS — R52 Pain, unspecified: Secondary | ICD-10-CM

## 2020-01-22 DIAGNOSIS — Z91010 Allergy to peanuts: Secondary | ICD-10-CM | POA: Insufficient documentation

## 2020-01-22 DIAGNOSIS — R102 Pelvic and perineal pain: Secondary | ICD-10-CM | POA: Insufficient documentation

## 2020-01-22 DIAGNOSIS — I10 Essential (primary) hypertension: Secondary | ICD-10-CM | POA: Insufficient documentation

## 2020-01-22 DIAGNOSIS — R109 Unspecified abdominal pain: Secondary | ICD-10-CM | POA: Diagnosis not present

## 2020-01-22 DIAGNOSIS — M549 Dorsalgia, unspecified: Secondary | ICD-10-CM | POA: Diagnosis present

## 2020-01-22 LAB — CBC WITH DIFFERENTIAL/PLATELET
Abs Immature Granulocytes: 0.01 10*3/uL (ref 0.00–0.07)
Basophils Absolute: 0 10*3/uL (ref 0.0–0.1)
Basophils Relative: 1 %
Eosinophils Absolute: 0.2 10*3/uL (ref 0.0–0.5)
Eosinophils Relative: 4 %
HCT: 34.2 % — ABNORMAL LOW (ref 36.0–46.0)
Hemoglobin: 11.1 g/dL — ABNORMAL LOW (ref 12.0–15.0)
Immature Granulocytes: 0 %
Lymphocytes Relative: 41 %
Lymphs Abs: 1.4 10*3/uL (ref 0.7–4.0)
MCH: 29.9 pg (ref 26.0–34.0)
MCHC: 32.5 g/dL (ref 30.0–36.0)
MCV: 92.2 fL (ref 80.0–100.0)
Monocytes Absolute: 0.4 10*3/uL (ref 0.1–1.0)
Monocytes Relative: 12 %
Neutro Abs: 1.4 10*3/uL — ABNORMAL LOW (ref 1.7–7.7)
Neutrophils Relative %: 42 %
Platelets: 557 10*3/uL — ABNORMAL HIGH (ref 150–400)
RBC: 3.71 MIL/uL — ABNORMAL LOW (ref 3.87–5.11)
RDW: 13.1 % (ref 11.5–15.5)
WBC: 3.4 10*3/uL — ABNORMAL LOW (ref 4.0–10.5)
nRBC: 0 % (ref 0.0–0.2)

## 2020-01-22 LAB — LIPASE, BLOOD: Lipase: 20 U/L (ref 11–51)

## 2020-01-22 LAB — URINALYSIS, ROUTINE W REFLEX MICROSCOPIC
Bilirubin Urine: NEGATIVE
Glucose, UA: NEGATIVE mg/dL
Hgb urine dipstick: NEGATIVE
Ketones, ur: NEGATIVE mg/dL
Leukocytes,Ua: NEGATIVE
Nitrite: NEGATIVE
Protein, ur: NEGATIVE mg/dL
Specific Gravity, Urine: 1.015 (ref 1.005–1.030)
pH: 6 (ref 5.0–8.0)

## 2020-01-22 LAB — COMPREHENSIVE METABOLIC PANEL
ALT: 13 U/L (ref 0–44)
AST: 16 U/L (ref 15–41)
Albumin: 3.5 g/dL (ref 3.5–5.0)
Alkaline Phosphatase: 66 U/L (ref 38–126)
Anion gap: 11 (ref 5–15)
BUN: 12 mg/dL (ref 6–20)
CO2: 26 mmol/L (ref 22–32)
Calcium: 9.3 mg/dL (ref 8.9–10.3)
Chloride: 103 mmol/L (ref 98–111)
Creatinine, Ser: 0.6 mg/dL (ref 0.44–1.00)
GFR calc Af Amer: >60 mL/min (ref >60)
GFR calc non Af Amer: >60 mL/min (ref >60)
Glucose, Bld: 95 mg/dL (ref 70–99)
Potassium: 4.2 mmol/L (ref 3.5–5.1)
Sodium: 140 mmol/L (ref 135–145)
Total Bilirubin: 0.3 mg/dL (ref 0.3–1.2)
Total Protein: 7.8 g/dL (ref 6.5–8.1)

## 2020-01-22 MED ORDER — IOHEXOL 300 MG/ML  SOLN
100.0000 mL | Freq: Once | INTRAMUSCULAR | Status: AC | PRN
  Administered 2020-01-22: 100 mL via INTRAVENOUS

## 2020-01-22 MED ORDER — OXYCODONE-ACETAMINOPHEN 5-325 MG PO TABS
2.0000 | ORAL_TABLET | Freq: Once | ORAL | Status: AC
  Administered 2020-01-22: 2 via ORAL
  Filled 2020-01-22: qty 2

## 2020-01-22 MED ORDER — METHYLPREDNISOLONE 4 MG PO TBPK: ORAL_TABLET | ORAL | 0 refills | Status: AC

## 2020-01-22 MED ORDER — HYDROMORPHONE HCL 1 MG/ML IJ SOLN
1.0000 mg | Freq: Once | INTRAMUSCULAR | Status: AC
  Administered 2020-01-22: 1 mg via INTRAVENOUS
  Filled 2020-01-22: qty 1

## 2020-01-22 MED ORDER — DIAZEPAM 5 MG/ML IJ SOLN
5.0000 mg | Freq: Once | INTRAMUSCULAR | Status: AC
  Administered 2020-01-22: 5 mg via INTRAVENOUS
  Filled 2020-01-22: qty 2

## 2020-01-22 MED ORDER — DEXAMETHASONE SODIUM PHOSPHATE 10 MG/ML IJ SOLN
10.0000 mg | Freq: Once | INTRAMUSCULAR | Status: AC
  Administered 2020-01-22: 10 mg via INTRAVENOUS
  Filled 2020-01-22: qty 1

## 2020-01-22 NOTE — ED Notes (Signed)
ED Provider at bedside. 

## 2020-01-22 NOTE — ED Triage Notes (Signed)
Per EMS, pt had back surgery earlier this month. She had a pelvic infection while she was in the hospital. She has been improving since discharge. Now c/o severe back pain x 2 days. Taking oxycodone and a muscle relaxer without relief.

## 2020-01-22 NOTE — ED Notes (Signed)
Patient transported to CT 

## 2020-01-22 NOTE — Discharge Instructions (Signed)
Continue home pain medication regimen.  Take steroids as prescribed.  Follow-up with your surgeon and consider establishing care with a chronic pain physician with them.

## 2020-01-22 NOTE — ED Provider Notes (Addendum)
[facility] EMERGENCY DEPARTMENT Provider Note   CSN: [id number] Arrival date & time: 01/22/20  1209     History Chief Complaint  Patient presents with  . Back Pain  . Abdominal Pain    Gina Cummings is a 54 y.o. female.  Patient with chronic back pain status post lumbar fusion several weeks ago.  Home narcotics have not helped with increasing pain over the last several days.  No new trauma but felt like she twisted funny the other day.  No new numbness or weakness.  No loss of bowel or bladder.  No urinary retention.  However is having some lower pelvic abdominal pain.  Possibly some pain with urination.  The history is provided by the patient.  Back Pain Location:  Lumbar spine Quality:  Aching Radiates to:  Does not radiate Pain severity:  Moderate Onset quality:  Gradual Timing:  Intermittent Progression:  Waxing and waning Chronicity:  Recurrent Relieved by:  Nothing Worsened by:  Movement Associated symptoms: abdominal pain and pelvic pain   Associated symptoms: no abdominal swelling, no bladder incontinence, no bowel incontinence, no chest pain, no dysuria, no fever, no headaches, no leg pain, no numbness, no paresthesias, no perianal numbness, no tingling, no weakness and no weight loss   Risk factors: recent surgery        Past Medical History:  Diagnosis Date  . Asthma   . Chronic back pain   . Chronic, continuous use of opioids   . Drug-seeking behavior   . Hypertension   . Stroke [facility](HCC)    1 time    Patient Active Problem List   Diagnosis Date Noted  . Spondylolisthesis at L4-L5 level 01/04/2020  . OA (osteoarthritis) of knee 10/12/2019    Past Surgical History:  Procedure Laterality Date  . ABDOMINAL HYSTERECTOMY    . CHOLECYSTECTOMY    . KNEE SURGERY       OB History   No obstetric history on file.     Family History  Problem Relation Age of Onset  . Cancer Mother   . CAD Father   . CAD Maternal Grandmother      Social History   Tobacco Use  . Smoking status: Never Smoker  . Smokeless tobacco: Never Used  Vaping Use  . Vaping Use: Never used  Substance Use Topics  . Alcohol use: Never  . Drug use: Never    Home Medications Prior to Admission medications   Medication Sig Start Date End Date Taking? Authorizing Provider  ADVAIR DISKUS 100-50 MCG/DOSE AEPB Inhale 1 puff into the lungs in the morning and at bedtime. 07/27/19   [provider]  albuterol (VENTOLIN HFA) 108 (90 Base) MCG/ACT inhaler Inhale 1-2 puffs into the lungs 4 (four) times daily as needed for wheezing or shortness of breath. 06/30/19   [provider]  amLODipine (NORVASC) 10 MG tablet Take 0.5 tablets (5 mg total) by mouth daily. Patient not taking: Reported on 12/23/2019 03/13/19   Mancel BaleWentz, Elliott, MD  cloNIDine (CATAPRES) 0.1 MG tablet Take 1 tablet (0.1 mg total) by mouth 2 (two) times daily. Patient not taking: Reported on 12/23/2019 02/14/19   Ward, Layla MawKristen N, DO  diazepam (VALIUM) 5 MG tablet Take 1 tablet (5 mg total) by mouth every 6 (six) hours as needed for muscle spasms. 01/09/20   Bedelia Personhomas, Jonathan G, MD  famotidine (PEPCID) 20 MG tablet Take 1 tablet (20 mg total) by mouth 2 (two) times daily. Patient not taking: Reported on  12/23/2019 10/14/19   Michela Pitcher A, PA-C  hydrochlorothiazide (HYDRODIURIL) 25 MG tablet Take 1 tablet (25 mg total) by mouth daily. Patient not taking: Reported on 05/18/2019 02/14/19   Ward, Layla Maw, DO  methocarbamol (ROBAXIN) 500 MG tablet Take 1 tablet (500 mg total) by mouth 4 (four) times daily. 01/09/20   Bedelia Person, MD  methylPREDNISolone (MEDROL DOSEPAK) 4 MG TBPK tablet Follow package insert 01/22/20   Holdan Stucke, DO  ondansetron (ZOFRAN ODT) 4 MG disintegrating tablet Take 1 tablet (4 mg total) by mouth every 8 (eight) hours as needed for nausea or vomiting. Patient not taking: Reported on 12/23/2019 10/14/19   Michela Pitcher A, PA-C  oxyCODONE-acetaminophen  (PERCOCET) 10-325 MG tablet Take 1 tablet by mouth every 6 (six) hours as needed for up to 21 days for pain. 01/09/20 01/30/20  Bedelia Person, MD  senna (SENOKOT) 8.6 MG TABS tablet Take 1 tablet (8.6 mg total) by mouth 2 (two) times daily. 01/09/20   Bedelia Person, MD  senna-docusate (SENOKOT-S) 8.6-50 MG tablet Take 1 tablet by mouth at bedtime as needed for mild constipation or moderate constipation. Patient not taking: Reported on 12/23/2019 10/04/19   Maia Plan, MD    Allergies    Peanut-containing drug products, Shellfish allergy, Penicillins, Aspirin, Gabapentin, Ibuprofen, Lisinopril, Morphine and related, Nsaids, Tramadol, Trazodone, Fentanyl, and Morphine  Review of Systems   Review of Systems  Constitutional: Negative for chills, fever and weight loss.  HENT: Negative for ear pain and sore throat.   Eyes: Negative for pain and visual disturbance.  Respiratory: Negative for cough and shortness of breath.   Cardiovascular: Negative for chest pain and palpitations.  Gastrointestinal: Positive for abdominal pain. Negative for bowel incontinence and vomiting.  Genitourinary: Positive for pelvic pain. Negative for bladder incontinence, dysuria and hematuria.  Musculoskeletal: Positive for back pain. Negative for arthralgias.  Skin: Negative for color change and rash.  Neurological: Negative for tingling, seizures, syncope, weakness, numbness, headaches and paresthesias.  All other systems reviewed and are negative.   Physical Exam Updated Vital Signs  ED Triage Vitals  Enc Vitals Group     BP 01/22/20 1213 (!) 213/129     Pulse Rate 01/22/20 1213 91     Resp 01/22/20 1213 (!) 24     Temp 01/22/20 1213 98.3 F (36.8 C)     Temp Source 01/22/20 1213 Oral     SpO2 01/22/20 1213 98 %     Weight 01/22/20 1214 215 lb (97.5 kg)     Height --      Head Circumference --      Peak Flow --      Pain Score 01/22/20 1213 10     Pain Loc --      Pain Edu? --      Excl. in  GC? --     Physical Exam Vitals and nursing note reviewed.  Constitutional:      General: She is not in acute distress.    Appearance: She is well-developed. She is not ill-appearing.  HENT:     Head: Normocephalic and atraumatic.     Mouth/Throat:     Mouth: Mucous membranes are moist.  Eyes:     Extraocular Movements: Extraocular movements intact.     Conjunctiva/sclera: Conjunctivae normal.     Pupils: Pupils are equal, round, and reactive to light.  Cardiovascular:     Rate and Rhythm: Normal rate and regular rhythm.     Heart  sounds: Normal heart sounds. No murmur heard.   Pulmonary:     Effort: Pulmonary effort is normal. No respiratory distress.     Breath sounds: Normal breath sounds.  Abdominal:     General: Abdomen is flat. Bowel sounds are normal.     Palpations: Abdomen is soft.     Tenderness: There is abdominal tenderness in the suprapubic area. There is no right CVA tenderness, left CVA tenderness, guarding or rebound. Negative signs include Murphy's sign.  Genitourinary:    Rectum: Normal.  Musculoskeletal:     Cervical back: Neck supple.     Comments: No midline spinal tenderness specifically on exam, surgical scar is well-healing, some paraspinal lumbar muscle tenderness  Skin:    General: Skin is warm and dry.     Capillary Refill: Capillary refill takes less than 2 seconds.     Comments: Surgical area is clean dry and intact, there is no warmth, no drainage  Neurological:     General: No focal deficit present.     Mental Status: She is alert and oriented to person, place, and time.     Cranial Nerves: No cranial nerve deficit.     Motor: No weakness.     Comments: 5+ out of 5 strength throughout, normal sensation, no drift, normal finger-to-nose finger     ED Results / Procedures / Treatments   Labs (all labs ordered are listed, but only abnormal results are displayed) Labs Reviewed  CBC WITH DIFFERENTIAL/PLATELET - Abnormal; Notable for the  following components:      Result Value   WBC 3.4 (*)    RBC 3.71 (*)    Hemoglobin 11.1 (*)    HCT 34.2 (*)    Platelets 557 (*)    Neutro Abs 1.4 (*)    All other components within normal limits  URINE CULTURE  URINALYSIS, ROUTINE W REFLEX MICROSCOPIC  COMPREHENSIVE METABOLIC PANEL  LIPASE, BLOOD    EKG None  Radiology CT L-SPINE NO CHARGE  Result Date: 01/22/2020 CLINICAL DATA:  Low back pain, recent surgery EXAM: CT lumbar spine without contrast TECHNIQUE: Multiplanar CT images of the thoracic spine were reconstructed from contemporary CT of the Chest. CONTRAST:  No additional COMPARISON:  Correlation made with CT abdomen 05/18/2019, MRI lumbar spine 11/30/2019 FINDINGS: Alignment: Anteroposterior alignment is maintained. Vertebrae: Interval fusion at L4-L5 with rods and pedicle screws and interbody spacer. Laminectomies are present at this level. Hardware appears intact and there is associated streak artifact. Vertebral body heights are preserved. There is sclerosis at the inferior T11 endplate and to a much lesser extent at the superior T12 endplate. Compared to the prior CT abdomen, there is fragmentation of the right anterolateral bridging osteophyte. Surrounding soft tissue swelling appears to have been present on the prior CT is well. Corresponding marrow and soft tissue edema noted on the MRI. Paraspinal and other soft tissues: As above. Postoperative changes in the dorsal subcutaneous fat probably extending into the laminectomy bed, which is poorly evaluated. Disc levels: Intervertebral disc heights are maintained. Canal is obscured at L4-L5. There is facet hypertrophy at L5-S1. IMPRESSION: Postoperative changes at L4-L5 without evidence hardware complication. 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 Sclerosis at the T11-T12 endplates. Marrow and soft tissue edema present on the prior MRI. This is probably on a degenerative basis with  some superimposed inflammation. Electronically Signed   By: Guadlupe Spanish M.D.   On: 01/22/2020 14:45    Procedures Procedures (including critical  care time)  Medications Ordered in ED Medications  HYDROmorphone (DILAUDID) injection 1 mg (1 mg Intravenous Given 01/22/20 1243)  iohexol (OMNIPAQUE) 300 MG/ML solution 100 mL (100 mLs Intravenous Contrast Given 01/22/20 1337)  diazepam (VALIUM) injection 5 mg (5 mg Intravenous Given 01/22/20 1441)    ED Course  I have reviewed the triage vital signs and the nursing notes.  Pertinent labs & imaging results that were available during my care of the patient were reviewed by me and considered in my medical decision making (see chart for details).    MDM Rules/Calculators/A&P                          Gina Cummings is a 54 year old female with history of chronic back pain status post recent lumbar fusion who presents the ED with back pain, abdominal pain.  Patient with overall unremarkable vitals.  No fever.  States that she has had lower back tightness that is worse over the last several days.  She is working with home physical therapy.  Using a walker.  Has increased activities recently.  Also having some lower pelvic pain with some may be dysuria.  Pretty tender in the suprapubic region on exam.  Neurologically she is intact.  No symptoms to suggest cauda equina.  Has some paraspinal lumbar muscle tenderness specifically on the left greater than the right.  Will get lab work including CT scan abdomen and pelvis.  Will give IV Dilaudid.  Has been taking muscle relaxants, home narcotics with some improvement.  Suspect possible UTI versus other intra-abdominal process such as bowel obstruction.  Possibly constipation.  Urinalysis negative for infection.  No significant anemia, electrolyte adenopathy, kidney injury.  Awaiting CT scan abdomen and pelvis.  Patient feeling improved following IV Dilaudid.  Given IV Valium also for muscle relaxant.  Has  arranged for family to be at the house today.  CT imaging overall unremarkable.  There appears to be possibly seroma in the dorsal soft tissue area at the level of the patient spinal surgery.  However there is no drainage, no warmth.  Patient has no fever, no white count.  No concern for infectious process.  Case discussed with Leo Grosser with neurosurgery.  Will give dose of IV Decadron after discussion with her.  She will follow up with the patient this week.  Given home dose of narcotic pain medicine and discharged from the ED in good condition.  Will prescribe Medrol Dosepak.  Will follow up with her surgeon and will discuss need for chronic pain physician.  This chart was dictated using voice recognition software.  Despite best efforts to proofread,  errors can occur which can change the documentation meaning.    Final Clinical Impression(s) / ED Diagnoses Final diagnoses:  Pain  Acute low back pain without sciatica, unspecified back pain laterality    Rx / DC Orders ED Discharge Orders         Ordered    methylPREDNISolone (MEDROL DOSEPAK) 4 MG TBPK tablet     Discontinue  Reprint     01/22/20 1442           Virgina Norfolk, DO 01/22/20 1453    Virgina Norfolk, DO 01/22/20 1513    Oreta Soloway, DO 01/22/20 1521

## 2020-01-22 NOTE — ED Notes (Signed)
Pt returned from CT. C/o increased pain. Dr. Lockie Mola, ED Provider at bedside.

## 2020-01-24 LAB — URINE CULTURE

## 2020-01-27 ENCOUNTER — Other Ambulatory Visit: Payer: Self-pay

## 2020-01-27 ENCOUNTER — Emergency Department (HOSPITAL_BASED_OUTPATIENT_CLINIC_OR_DEPARTMENT_OTHER)
Admission: EM | Admit: 2020-01-27 | Discharge: 2020-01-27 | Disposition: A | Discharge status: Home / Self Care | Payer: Medicare HMO | Attending: Emergency Medicine | Admitting: Emergency Medicine

## 2020-01-27 ENCOUNTER — Encounter (HOSPITAL_BASED_OUTPATIENT_CLINIC_OR_DEPARTMENT_OTHER): Payer: Self-pay | Admitting: Emergency Medicine

## 2020-01-27 ENCOUNTER — Ambulatory Visit: Payer: Medicare HMO | Admitting: Nurse Practitioner

## 2020-01-27 DIAGNOSIS — Z79899 Other long term (current) drug therapy: Secondary | ICD-10-CM | POA: Insufficient documentation

## 2020-01-27 DIAGNOSIS — M546 Pain in thoracic spine: Secondary | ICD-10-CM | POA: Insufficient documentation

## 2020-01-27 DIAGNOSIS — J45909 Unspecified asthma, uncomplicated: Secondary | ICD-10-CM | POA: Diagnosis not present

## 2020-01-27 DIAGNOSIS — Z91010 Allergy to peanuts: Secondary | ICD-10-CM | POA: Insufficient documentation

## 2020-01-27 DIAGNOSIS — I10 Essential (primary) hypertension: Secondary | ICD-10-CM | POA: Insufficient documentation

## 2020-01-27 DIAGNOSIS — M545 Low back pain: Secondary | ICD-10-CM | POA: Diagnosis not present

## 2020-01-27 DIAGNOSIS — G8918 Other acute postprocedural pain: Secondary | ICD-10-CM | POA: Insufficient documentation

## 2020-01-27 DIAGNOSIS — M542 Cervicalgia: Secondary | ICD-10-CM | POA: Diagnosis not present

## 2020-01-27 DIAGNOSIS — G8928 Other chronic postprocedural pain: Secondary | ICD-10-CM

## 2020-01-27 MED ORDER — HYDROCHLOROTHIAZIDE 25 MG PO TABS
25.0000 mg | ORAL_TABLET | Freq: Once | ORAL | Status: AC
  Administered 2020-01-27: 25 mg via ORAL
  Filled 2020-01-27: qty 1

## 2020-01-27 MED ORDER — AMLODIPINE BESYLATE 5 MG PO TABS: 5.0000 mg | ORAL_TABLET | Freq: Every day | ORAL | 0 refills | Status: AC

## 2020-01-27 MED ORDER — AMLODIPINE BESYLATE 5 MG PO TABS
5.0000 mg | ORAL_TABLET | Freq: Once | ORAL | Status: AC
  Administered 2020-01-27: 5 mg via ORAL
  Filled 2020-01-27: qty 1

## 2020-01-27 MED ORDER — HYDROCHLOROTHIAZIDE 25 MG PO TABS: 25.0000 mg | ORAL_TABLET | Freq: Every day | ORAL | 1 refills | Status: AC

## 2020-01-27 MED ORDER — HYDROMORPHONE HCL 1 MG/ML IJ SOLN
1.0000 mg | Freq: Once | INTRAMUSCULAR | Status: AC
  Administered 2020-01-27: 1 mg via INTRAMUSCULAR
  Filled 2020-01-27: qty 1

## 2020-01-27 MED ORDER — ONDANSETRON 4 MG PO TBDP
8.0000 mg | ORAL_TABLET | Freq: Once | ORAL | Status: AC
  Administered 2020-01-27: 8 mg via ORAL
  Filled 2020-01-27: qty 2

## 2020-01-27 NOTE — ED Triage Notes (Addendum)
Pt states she is having high blood pressure and does not have a regular PCP to manage.  Pt also c/o continuing lower back pain.  Pt states she is seeing a surgeon for pain management but pain is still severe.  Pt has not filled the prednisone prescription given to her on the last visit yet.

## 2020-01-27 NOTE — ED Provider Notes (Signed)
[facility] EMERGENCY DEPARTMENT Provider Note   CSN: [id number] Arrival date & time: 01/27/20  1125     History Chief Complaint  Patient presents with  . Hypertension    Gina Cummings is a 54 y.o. female.  54 year old female presents with complaint of ongoing low back pain.  Patient states that she had a lumbar fusion at the end of June and that her surgeon has only prescribed 5 mg oxycodone and this is not enough to control her pain and states that she needs an injection of Dilaudid today.  Patient states she last took her pain medication at 2:00 this morning and states that this does not touch it.  Patient states that she is not scheduled to see her surgeon again until August 10 and has not able to get in with pain management until the end of September.  Patient does not have a PCP, states that she moved here in January from [place] and is waiting for someone to give her a list of Medicare providers.  Patient does have a home health provider, states that they are concerned that her blood pressure is so high.  Patient does have a history of hypertension, she does not take any medication for her blood pressure for the past 3 months due to not having a PCP to prescribe it.  Denies chest pain, visual disturbance, weakness or numbness, no other complaints or concerns today.        Past Medical History:  Diagnosis Date  . Asthma   . Chronic back pain   . Chronic, continuous use of opioids   . Drug-seeking behavior   . Hypertension   . Stroke [facility])    1 time    Patient Active Problem List   Diagnosis Date Noted  . Spondylolisthesis at L4-L5 level 01/04/2020  . OA (osteoarthritis) of knee 10/12/2019    Past Surgical History:  Procedure Laterality Date  . ABDOMINAL HYSTERECTOMY    . CHOLECYSTECTOMY    . KNEE SURGERY       OB History   No obstetric history on file.     Family History  Problem Relation Age of Onset  . Cancer Mother   . CAD  Father   . CAD Maternal Grandmother     Social History   Tobacco Use  . Smoking status: Never Smoker  . Smokeless tobacco: Never Used  Vaping Use  . Vaping Use: Never used  Substance Use Topics  . Alcohol use: Never  . Drug use: Never    Home Medications Prior to Admission medications   Medication Sig Start Date End Date Taking? Authorizing Provider  ADVAIR DISKUS 100-50 MCG/DOSE AEPB Inhale 1 puff into the lungs in the morning and at bedtime. 07/27/19  Yes [provider]  albuterol (VENTOLIN HFA) 108 (90 Base) MCG/ACT inhaler Inhale 1-2 puffs into the lungs 4 (four) times daily as needed for wheezing or shortness of breath. 06/30/19  Yes [provider]  amLODipine (NORVASC) 5 MG tablet Take 1 tablet (5 mg total) by mouth daily. 01/27/20 02/26/20 Yes Jeannie Fend, PA-C  cloNIDine (CATAPRES) 0.1 MG tablet Take 1 tablet (0.1 mg total) by mouth 2 (two) times daily. 02/14/19  Yes Ward, Layla Maw, DO  famotidine (PEPCID) 20 MG tablet Take 1 tablet (20 mg total) by mouth 2 (two) times daily. 10/14/19  Yes Fawze, Mina A, PA-C  hydrochlorothiazide (HYDRODIURIL) 25 MG tablet Take 1 tablet (25 mg total) by mouth daily. 01/27/20 02/26/20  Yes Army Melia A, PA-C  oxyCODONE (OXY IR/ROXICODONE) 5 MG immediate release tablet Take 5 mg by mouth every 4 (four) hours as needed for pain. 01/23/20  Yes [provider]  senna (SENOKOT) 8.6 MG TABS tablet Take 1 tablet (8.6 mg total) by mouth 2 (two) times daily. 01/09/20  Yes Bedelia Person, MD  tiZANidine (ZANAFLEX) 4 MG tablet Take 4 mg by mouth 3 (three) times daily as needed for muscle spasms. 01/17/20  Yes [provider]  methylPREDNISolone (MEDROL DOSEPAK) 4 MG TBPK tablet Follow package insert 01/22/20   Curatolo, Adam, DO    Allergies    Peanut-containing drug products, Shellfish allergy, Penicillins, Aspirin, Gabapentin, Ibuprofen, Lisinopril, Morphine and related, Nsaids, Tramadol, Trazodone, Fentanyl, and  Morphine  Review of Systems   Review of Systems  Constitutional: Negative for fever.  Respiratory: Negative for shortness of breath.   Cardiovascular: Negative for chest pain and leg swelling.  Gastrointestinal: Negative for abdominal pain, nausea and vomiting.  Genitourinary: Negative for dysuria and frequency.  Musculoskeletal: Positive for back pain.  Skin: Negative for rash and wound.  Neurological: Negative for weakness and numbness.  All other systems reviewed and are negative.   Physical Exam Updated Vital Signs BP (!) 196/104   Pulse 87   Temp 98.3 F (36.8 C) (Oral)   Resp 20   Ht 4\' 11"  (1.499 m)   Wt (!) 93.9 kg   SpO2 100%   BMI 41.81 kg/m   Physical Exam Vitals and nursing note reviewed.  Constitutional:      General: She is not in acute distress.    Appearance: She is well-developed. She is not diaphoretic.  HENT:     Head: Normocephalic and atraumatic.  Cardiovascular:     Rate and Rhythm: Normal rate and regular rhythm.     Pulses: Normal pulses.     Heart sounds: Normal heart sounds.  Pulmonary:     Effort: Pulmonary effort is normal.     Breath sounds: Normal breath sounds.  Abdominal:     Palpations: Abdomen is soft.     Tenderness: There is no abdominal tenderness.  Musculoskeletal:       Back:     Right lower leg: No edema.     Left lower leg: No edema.  Skin:    General: Skin is warm and dry.     Findings: No erythema or rash.  Neurological:     Mental Status: She is alert and oriented to person, place, and time.     Sensory: Sensation is intact. No sensory deficit.     Motor: No weakness.     Deep Tendon Reflexes: Reflexes normal. Babinski sign absent on the right side. Babinski sign absent on the left side.     Reflex Scores:      Patellar reflexes are 1+ on the right side and 2+ on the left side.      Achilles reflexes are 1+ on the right side and 1+ on the left side. Psychiatric:        Behavior: Behavior normal.     ED  Results / Procedures / Treatments   Labs (all labs ordered are listed, but only abnormal results are displayed) Labs Reviewed - No data to display  EKG None  Radiology No results found.  Procedures Procedures (including critical care time)  Medications Ordered in ED Medications  amLODipine (NORVASC) tablet 5 mg (5 mg Oral Given 01/27/20 1432)  hydrochlorothiazide (HYDRODIURIL) tablet 25 mg (25 mg  Oral Given 01/27/20 1431)  HYDROmorphone (DILAUDID) injection 1 mg (1 mg Intramuscular Given 01/27/20 1432)  ondansetron (ZOFRAN-ODT) disintegrating tablet 8 mg (8 mg Oral Given 01/27/20 1432)    ED Course  I have reviewed the triage vital signs and the nursing notes.  Pertinent labs & imaging results that were available during my care of the patient were reviewed by me and considered in my medical decision making (see chart for details).  Clinical Course as of Jan 26 1505  Fri Jan 27, 2020  [id number] 54 year old female presents with complaint of ongoing back pain, not managed by her 5 mg oxycodone at home.  Patient is specifically requesting injection of Dilaudid for her pain today.  Explained to patient I am unable to manage her chronic pain with narcotics through the emergency room.  Offered prescription for her blood pressure medication which she has not been compliant with for the past 3 months due to lack of PCP.  Denies falls or injuries or changes in her chronic pain pattern.  Exam is unremarkable with the exception of generalized diffuse neck pain, her reflexes are intact, sensation intact, strength to the feet normal. Patient was given Norvasc and HCTZ while in the ER, given Dilaudid IM after her ride arrived.  Patient is aware she needs to see her surgeon or pain management specialist for further management.  When asked about her prescription for prednisone from her last ER visit on July 18, patient states that the pharmacy was out of prednisone however it was supposed to come back in stock  today for her to pick up, then states that she actually did not pick it up because of lack of money.   [LM]    Clinical Course User Index [LM] Alden Hipp   MDM Rules/Calculators/A&P                          Final Clinical Impression(s) / ED Diagnoses Final diagnoses:  Hypertension, unspecified type  Other chronic postprocedural pain    Rx / DC Orders ED Discharge Orders         Ordered    amLODipine (NORVASC) 5 MG tablet  Daily     Discontinue  Reprint     01/27/20 1418    hydrochlorothiazide (HYDRODIURIL) 25 MG tablet  Daily     Discontinue  Reprint     01/27/20 1418           Jeannie Fend, PA-C 01/27/20 1506    Arby Barrette, MD 02/05/20 [phone]

## 2020-01-27 NOTE — Discharge Instructions (Signed)
Take your blood pressure medications daily as prescribed. Follow-up with your prescriber for pain management.

## 2020-03-12 ENCOUNTER — Encounter (HOSPITAL_BASED_OUTPATIENT_CLINIC_OR_DEPARTMENT_OTHER): Payer: Self-pay | Admitting: *Deleted

## 2020-03-12 ENCOUNTER — Emergency Department (HOSPITAL_BASED_OUTPATIENT_CLINIC_OR_DEPARTMENT_OTHER)
Admission: EM | Admit: 2020-03-12 | Discharge: 2020-03-12 | Disposition: A | Discharge status: Home / Self Care | Payer: Medicare HMO | Attending: Emergency Medicine | Admitting: Emergency Medicine

## 2020-03-12 ENCOUNTER — Other Ambulatory Visit: Payer: Self-pay

## 2020-03-12 DIAGNOSIS — Z79899 Other long term (current) drug therapy: Secondary | ICD-10-CM | POA: Insufficient documentation

## 2020-03-12 DIAGNOSIS — J45909 Unspecified asthma, uncomplicated: Secondary | ICD-10-CM | POA: Insufficient documentation

## 2020-03-12 DIAGNOSIS — M545 Low back pain, unspecified: Secondary | ICD-10-CM

## 2020-03-12 DIAGNOSIS — I10 Essential (primary) hypertension: Secondary | ICD-10-CM | POA: Diagnosis not present

## 2020-03-12 DIAGNOSIS — G8929 Other chronic pain: Secondary | ICD-10-CM | POA: Diagnosis not present

## 2020-03-12 DIAGNOSIS — M549 Dorsalgia, unspecified: Secondary | ICD-10-CM | POA: Diagnosis present

## 2020-03-12 LAB — BASIC METABOLIC PANEL
Anion gap: 9 (ref 5–15)
BUN: 24 mg/dL — ABNORMAL HIGH (ref 6–20)
CO2: 21 mmol/L — ABNORMAL LOW (ref 22–32)
Calcium: 9 mg/dL (ref 8.9–10.3)
Chloride: 105 mmol/L (ref 98–111)
Creatinine, Ser: 1.03 mg/dL — ABNORMAL HIGH (ref 0.44–1.00)
GFR calc Af Amer: >60 mL/min (ref >60)
GFR calc non Af Amer: >60 mL/min (ref >60)
Glucose, Bld: 88 mg/dL (ref 70–99)
Potassium: 4.2 mmol/L (ref 3.5–5.1)
Sodium: 135 mmol/L (ref 135–145)

## 2020-03-12 LAB — CBC WITH DIFFERENTIAL/PLATELET
Abs Immature Granulocytes: 0.01 10*3/uL (ref 0.00–0.07)
Basophils Absolute: 0 10*3/uL (ref 0.0–0.1)
Basophils Relative: 1 %
Eosinophils Absolute: 0.1 10*3/uL (ref 0.0–0.5)
Eosinophils Relative: 2 %
HCT: 36.9 % (ref 36.0–46.0)
Hemoglobin: 11.9 g/dL — ABNORMAL LOW (ref 12.0–15.0)
Immature Granulocytes: 0 %
Lymphocytes Relative: 32 %
Lymphs Abs: 1.6 10*3/uL (ref 0.7–4.0)
MCH: 29.8 pg (ref 26.0–34.0)
MCHC: 32.2 g/dL (ref 30.0–36.0)
MCV: 92.3 fL (ref 80.0–100.0)
Monocytes Absolute: 0.5 10*3/uL (ref 0.1–1.0)
Monocytes Relative: 9 %
Neutro Abs: 2.8 10*3/uL (ref 1.7–7.7)
Neutrophils Relative %: 56 %
Platelets: 343 10*3/uL (ref 150–400)
RBC: 4 MIL/uL (ref 3.87–5.11)
RDW: 14.6 % (ref 11.5–15.5)
WBC: 5 10*3/uL (ref 4.0–10.5)
nRBC: 0 % (ref 0.0–0.2)

## 2020-03-12 LAB — URINALYSIS, ROUTINE W REFLEX MICROSCOPIC
Bilirubin Urine: NEGATIVE
Glucose, UA: NEGATIVE mg/dL
Hgb urine dipstick: NEGATIVE
Ketones, ur: NEGATIVE mg/dL
Leukocytes,Ua: NEGATIVE
Nitrite: NEGATIVE
Protein, ur: NEGATIVE mg/dL
Specific Gravity, Urine: 1.025 (ref 1.005–1.030)
pH: 5.5 (ref 5.0–8.0)

## 2020-03-12 MED ORDER — HYDROMORPHONE HCL 1 MG/ML IJ SOLN
1.0000 mg | Freq: Once | INTRAMUSCULAR | Status: AC
  Administered 2020-03-12: 1 mg via INTRAVENOUS
  Filled 2020-03-12: qty 1

## 2020-03-12 NOTE — Discharge Instructions (Addendum)
Make sure to call your neurosurgeon office tomorrow.  You are given strong dose opioids today, start taking your opioids tomorrow.  If you have any new worsening concerning symptoms please come back to the emergency department these include incontinence, numbness, tingling, fevers.  As we discussed I did recommend getting further imaging of your back, however since you are ejected I need you to call your neurosurgeon office tomorrow as we spoke about. I hope you feel better!  Your blood pressure was slightly elevated here today in the emergency department, I need you to get this rechecked with your PCP.  If you have any chest pain, shortness of breath, severe headache, numbness, tingling, weakness please come back to the emergency department.

## 2020-03-12 NOTE — ED Triage Notes (Signed)
Pt reports having back surgery June 30. States that today, she stretched and felt a pop in her back. Reports pain continues. Pt ambulatory a short distance. Pt denies incontinence.

## 2020-03-12 NOTE — ED Provider Notes (Signed)
[facility] EMERGENCY DEPARTMENT Provider Note   CSN: [id number] Arrival date & time: 03/12/20  1437     History Chief Complaint  Patient presents with  . Back Pain    Prince Olivier is a 54 y.o. female with past medical history of hypertension, chronic back pain status post lumbar fusion in June that presents the emergency department today for back pain.  Patient states that she was stretching on her chair on Thursday and felt a pop in her back, states that since then she has been having severe pain in her back where she had her surgery.  States that the same thing happened in July when she came here, did get CT and labs at that time.  Patient states that she has not had any fevers, chills, nausea, vomiting, numbness, tingling, urinary incontinence, bowel incontinence, retention.  No IV drug use, no history of cancer.  States that she has been taking her narcotics as prescribed.  States that she has not been able to get her pain under control since Thursday, did call her neurosurgeon office who did write her more pain medication.  States that she cannot pick them up until tomorrow, came here for pain relief.  Denies any numbness and tingling down into her leg that is new, states that she does have infrequent sciatica on her left side.  This is not new.  Denies any headache, vision changes, rash, gait abnormality, weakness  States that she normally wears a back brace, has been wearing this.  HPI     Past Medical History:  Diagnosis Date  . Asthma   . Chronic back pain   . Chronic, continuous use of opioids   . Drug-seeking behavior   . Hypertension   . Stroke [facility])    1 time    Patient Active Problem List   Diagnosis Date Noted  . Spondylolisthesis at L4-L5 level 01/04/2020  . OA (osteoarthritis) of knee 10/12/2019    Past Surgical History:  Procedure Laterality Date  . ABDOMINAL HYSTERECTOMY    . CHOLECYSTECTOMY    . KNEE SURGERY       OB History   No  obstetric history on file.     Family History  Problem Relation Age of Onset  . Cancer Mother   . CAD Father   . CAD Maternal Grandmother     Social History   Tobacco Use  . Smoking status: Never Smoker  . Smokeless tobacco: Never Used  Vaping Use  . Vaping Use: Never used  Substance Use Topics  . Alcohol use: Never  . Drug use: Never    Home Medications Prior to Admission medications   Medication Sig Start Date End Date Taking? Authorizing Provider  ADVAIR DISKUS 100-50 MCG/DOSE AEPB Inhale 1 puff into the lungs in the morning and at bedtime. 07/27/19   [provider]  albuterol (VENTOLIN HFA) 108 (90 Base) MCG/ACT inhaler Inhale 1-2 puffs into the lungs 4 (four) times daily as needed for wheezing or shortness of breath. 06/30/19   [provider]  amLODipine (NORVASC) 5 MG tablet Take 1 tablet (5 mg total) by mouth daily. 01/27/20 02/26/20  Jeannie Fend, PA-C  cloNIDine (CATAPRES) 0.1 MG tablet Take 1 tablet (0.1 mg total) by mouth 2 (two) times daily. 02/14/19   Ward, Layla Maw, DO  famotidine (PEPCID) 20 MG tablet Take 1 tablet (20 mg total) by mouth 2 (two) times daily. 10/14/19   Fawze, Mina A, PA-C  hydrochlorothiazide (HYDRODIURIL)  25 MG tablet Take 1 tablet (25 mg total) by mouth daily. 01/27/20 02/26/20  Jeannie Fend, PA-C  methylPREDNISolone (MEDROL DOSEPAK) 4 MG TBPK tablet Follow package insert 01/22/20   Curatolo, Adam, DO  oxyCODONE (OXY IR/ROXICODONE) 5 MG immediate release tablet Take 5 mg by mouth every 4 (four) hours as needed for pain. 01/23/20   [provider]  senna (SENOKOT) 8.6 MG TABS tablet Take 1 tablet (8.6 mg total) by mouth 2 (two) times daily. 01/09/20   Bedelia Person, MD  tiZANidine (ZANAFLEX) 4 MG tablet Take 4 mg by mouth 3 (three) times daily as needed for muscle spasms. 01/17/20   [provider]    Allergies    Peanut-containing drug products, Shellfish allergy, Penicillins, Aspirin, Gabapentin, Ibuprofen,  Lisinopril, Morphine and related, Nsaids, Tramadol, Trazodone, Fentanyl, and Morphine  Review of Systems   Review of Systems  Constitutional: Negative for diaphoresis, fatigue and fever.  Eyes: Negative for visual disturbance.  Respiratory: Negative for shortness of breath.   Cardiovascular: Negative for chest pain.  Gastrointestinal: Negative for nausea and vomiting.  Musculoskeletal: Positive for back pain. Negative for arthralgias, gait problem, joint swelling and myalgias.  Skin: Negative for color change, pallor, rash and wound.  Neurological: Negative for syncope, weakness, light-headedness, numbness and headaches.  Psychiatric/Behavioral: Negative for behavioral problems and confusion.    Physical Exam Updated Vital Signs BP (!) 167/88 (BP Location: Right Wrist)   Pulse 75   Temp 98.9 F (37.2 C) (Oral)   Resp 18   Ht 4\' 11"  (1.499 m)   Wt 93 kg   SpO2 100%   BMI 41.40 kg/m   Physical Exam Constitutional:      General: She is not in acute distress.    Appearance: Normal appearance. She is not ill-appearing, toxic-appearing or diaphoretic.  HENT:     Mouth/Throat:     Mouth: Mucous membranes are moist.     Pharynx: Oropharynx is clear.  Eyes:     General: No scleral icterus.    Extraocular Movements: Extraocular movements intact.     Pupils: Pupils are equal, round, and reactive to light.  Cardiovascular:     Rate and Rhythm: Normal rate and regular rhythm.     Pulses: Normal pulses.     Heart sounds: Normal heart sounds.  Pulmonary:     Effort: Pulmonary effort is normal. No respiratory distress.     Breath sounds: Normal breath sounds. No stridor. No wheezing, rhonchi or rales.  Chest:     Chest wall: No tenderness.  Abdominal:     General: Abdomen is flat. There is no distension.     Palpations: Abdomen is soft.     Tenderness: There is no abdominal tenderness. There is no guarding or rebound.  Musculoskeletal:        General: No swelling or tenderness.  Normal range of motion.     Cervical back: Normal range of motion and neck supple. No rigidity.     Right lower leg: No edema.     Left lower leg: No edema.     Comments: No midline tenderness to cervical, thoracic spine.  There is midline tenderness to lumbar spine, where patient had fusion.  Scar visible without any erythema or warmth.  No deformities.  Normal sensation to groin or back.  Normal sensation and strength throughout bilateral legs.  Pulse 2+ PT and DP bilaterally.  Patient is able to walk normally.   Skin:    General: Skin is  warm and dry.     Capillary Refill: Capillary refill takes less than 2 seconds.     Coloration: Skin is not pale.  Neurological:     General: No focal deficit present.     Mental Status: She is alert and oriented to person, place, and time.     Comments: Alert. Clear speech. No facial droop. CNIII-XII grossly intact. Bilateral upper and lower extremities' sensation grossly intact. 5/5 symmetric strength with grip strength and with plantar and dorsi flexion bilaterally. . Normal finger to nose bilaterally. Negative pronator drift. Negative Romberg sign. Gait is steady and intact    Psychiatric:        Mood and Affect: Mood normal.        Behavior: Behavior normal.     ED Results / Procedures / Treatments   Labs (all labs ordered are listed, but only abnormal results are displayed) Labs Reviewed  BASIC METABOLIC PANEL - Abnormal; Notable for the following components:      Result Value   CO2 21 (*)    BUN 24 (*)    Creatinine, Ser 1.03 (*)    All other components within normal limits  CBC WITH DIFFERENTIAL/PLATELET - Abnormal; Notable for the following components:   Hemoglobin 11.9 (*)    All other components within normal limits  URINALYSIS, ROUTINE W REFLEX MICROSCOPIC    EKG None  Radiology No results found.  Procedures Procedures (including critical care time)  Medications Ordered in ED Medications  HYDROmorphone (DILAUDID)  injection 1 mg (1 mg Intravenous Given 03/12/20 1708)    ED Course  I have reviewed the triage vital signs and the nursing notes.  Pertinent labs & imaging results that were available during my care of the patient were reviewed by me and considered in my medical decision making (see chart for details).    MDM Rules/Calculators/A&P                           Lilli FewKathy Matlock Blue is a 54 y.o. female with past medical history of hypertension, chronic back pain status post lumbar fusion in June that presents the emergency department today for back pain.  Midline tenderness to area where patient had surgery, normal neuro exam with normal gait. No signs of cauda equina, incision without any erythema or warmth.  Did discuss that patient most likely needs imaging at this time since there was dorsal soft tissue stranding at the level of patient's spinal surgery with fluid collection, they called this most likely a postoperative seroma during the last CT 7/18..  Since patient is having midline tenderness and recent back surgery, however patient declined at this time.  Did discuss risks of this.  Patient states that she will have a follow-up with her back surgeon in 1 week, states that he normally gets imaging at that time.  CBC and BMP normal, upon reevaluation patient states that back pain has severely lessened, asking for prescription for higher dose pain medications, however patient states that neurosurgeon had just prescribe her some for tomorrow so I will not give her any more.  Did discuss that I can call neurosurgeon to have patient be seenn earlier, she states that she will call her tomorrow herself and get in this week.  States that she is ready for discharge.  Did discuss imaging option again with patient, patient rejected again.  Blood pressure slightly elevated upon second check, did discuss with patient and need to get  repeat.  Patient denies headache, change in vision, numbness, weakness, chest  pain, dyspnea, dizziness, or lightheadedness therefore doubt hypertensive emergency.     Doubt need for further emergent work up at this time. I explained the diagnosis and have given explicit precautions to return to the ER including for any other new or worsening symptoms. The patient understands and accepts the medical plan as it's been dictated and I have answered their questions. Discharge instructions concerning home care and prescriptions have been given. The patient is STABLE and is discharged to home in good condition.  I discussed this case with my attending physician, Dr. Rubin Payor who cosigned this note including patient's presenting symptoms, physical exam, and planned diagnostics and interventions. Attending physician stated agreement with plan or made changes to plan which were implemented.    Final Clinical Impression(s) / ED Diagnoses Final diagnoses:  Chronic midline low back pain, unspecified whether sciatica present    Rx / DC Orders ED Discharge Orders    None       Farrel Gordon, PA-C 03/12/20 1901    Benjiman Core, MD 03/13/20 0005

## 2020-03-14 ENCOUNTER — Ambulatory Visit (INDEPENDENT_AMBULATORY_CARE_PROVIDER_SITE_OTHER): Payer: Medicare HMO | Admitting: Primary Care

## 2020-03-17 IMAGING — CT CT ABD-PELV W/ CM
2 of 5 series · 17 of 46 positions shown, 19 images · IV contrast (OMNIPAQUE 300)
Comparison: None.

CLINICAL DATA: Abdominal pain.  Diverticulitis suspected.

EXAM:
CT ABDOMEN AND PELVIS WITH CONTRAST
TECHNIQUE: Multidetector CT imaging of the abdomen and pelvis was performed
using the standard protocol following bolus administration of
intravenous contrast.
CONTRAST:  100mL OMNIPAQUE IOHEXOL 300 MG/ML  SOLN

[Series 2: axial st · axial · 0.68mm/px · z∈[+1143,+1493]mm · 14 of 80 slices shown, 16 images]
[im 5/80  soft-tissue]
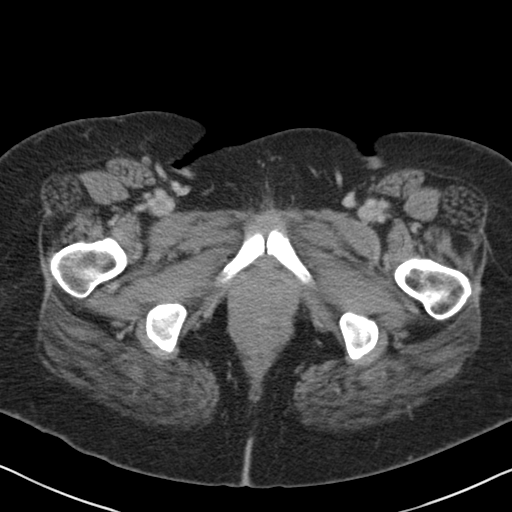
[im 5/80  bone]
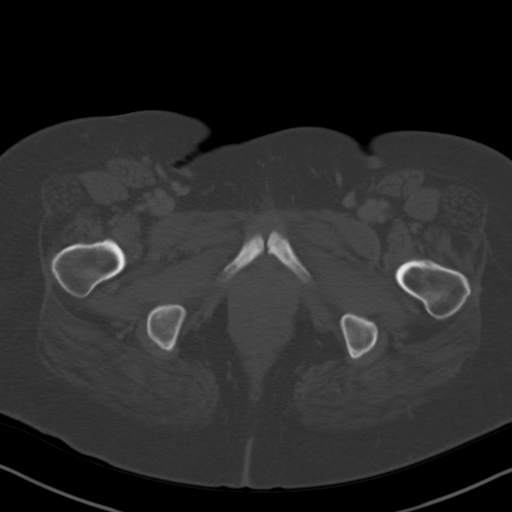
[im 10/80  soft-tissue]
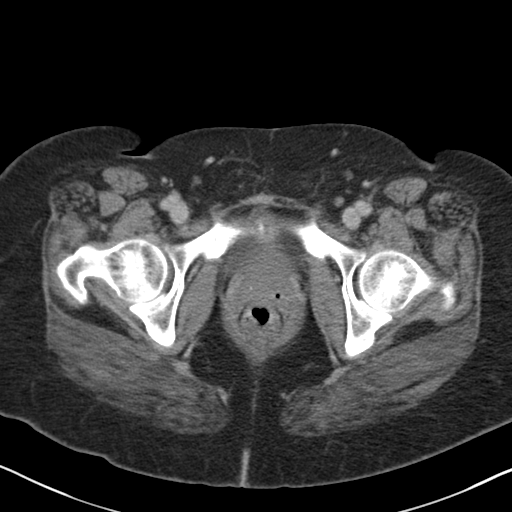
[im 15/80  soft-tissue]
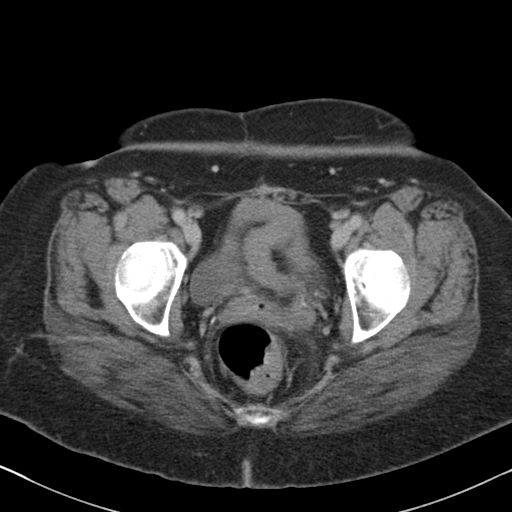
[im 20/80  soft-tissue]
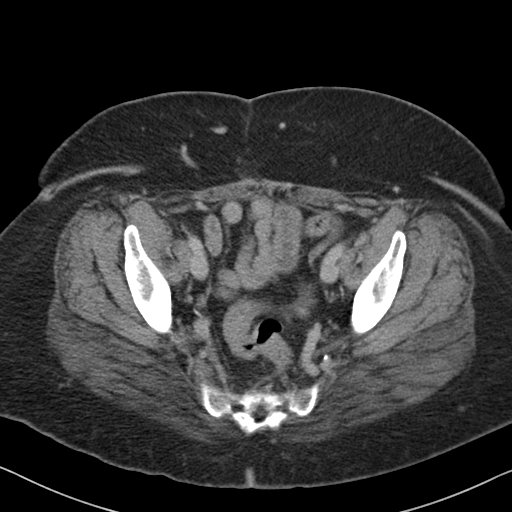
[im 25/80  soft-tissue]
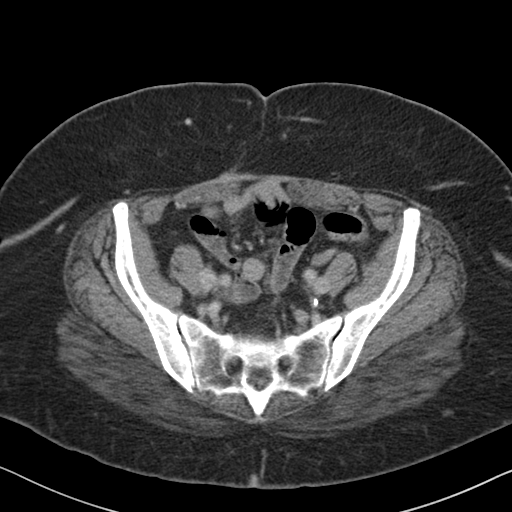
[im 30/80  soft-tissue]
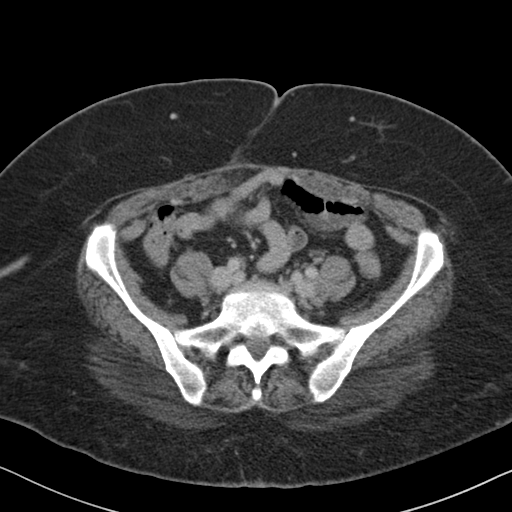
[im 35/80  soft-tissue]
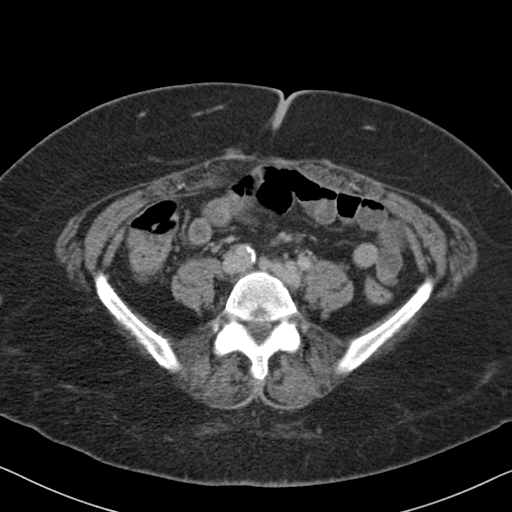
[im 45/80  soft-tissue]
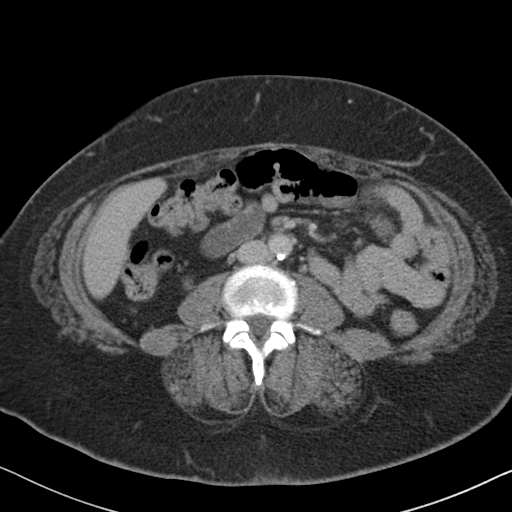
[im 50/80  soft-tissue]
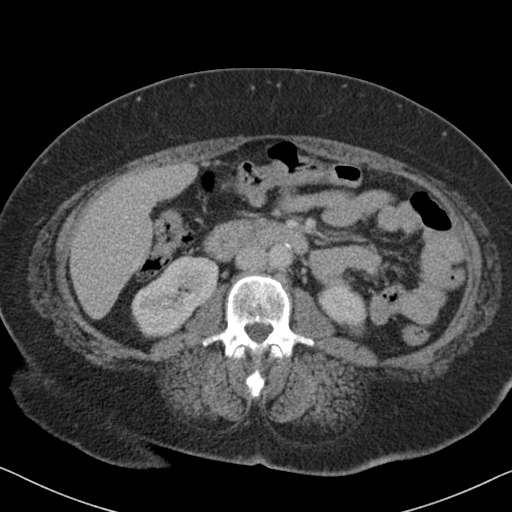
[im 50/80  bone]
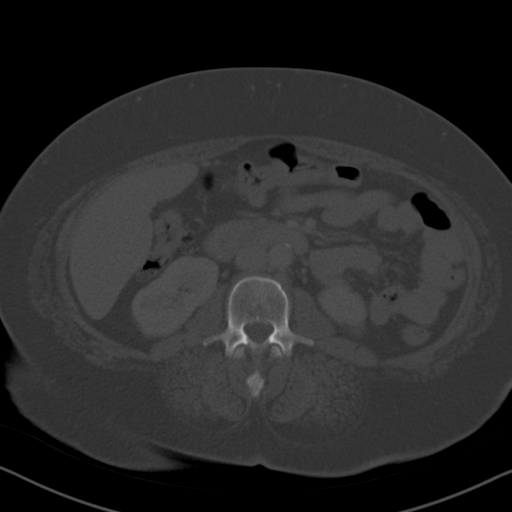
[im 55/80  soft-tissue]
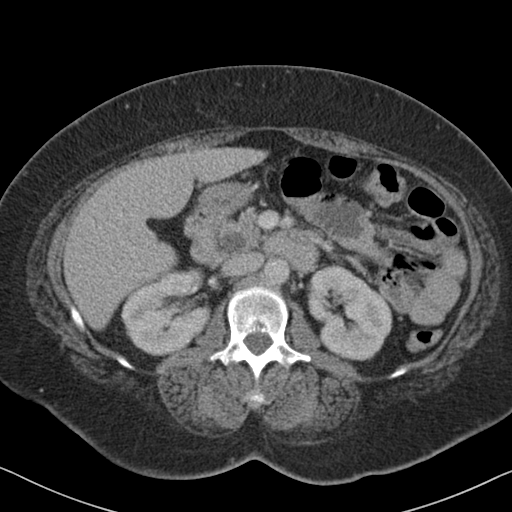
[im 60/80  soft-tissue]
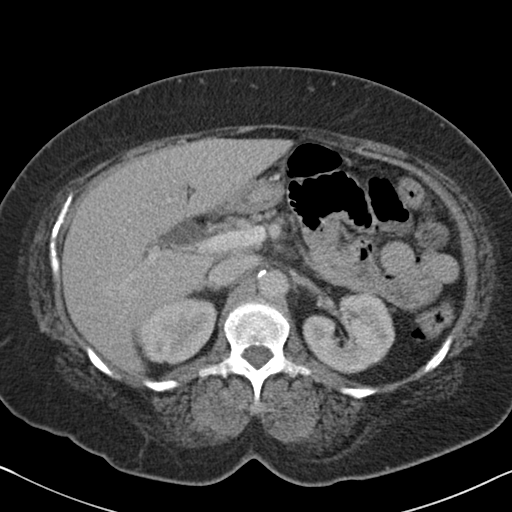
[im 65/80  soft-tissue]
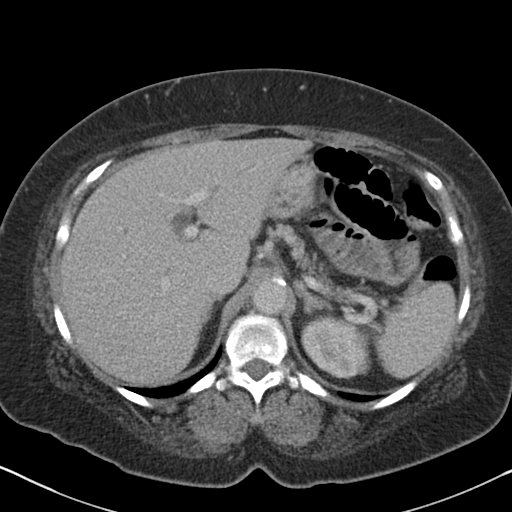
[im 70/80  soft-tissue]
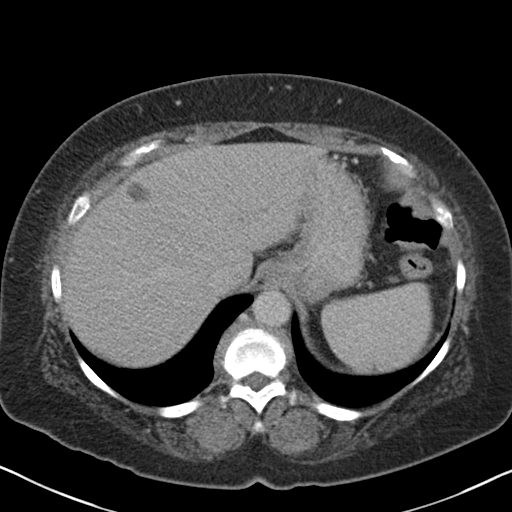
[im 75/80  soft-tissue]
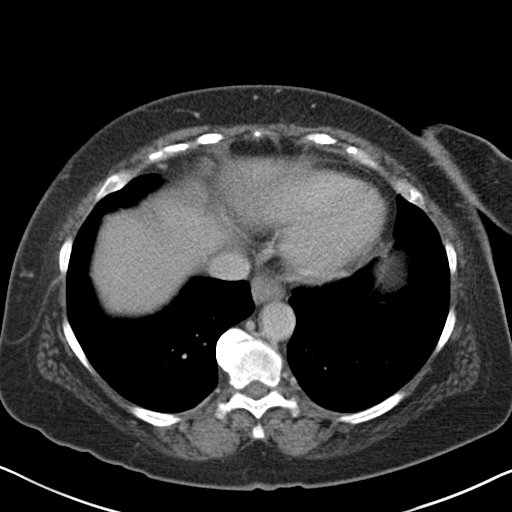

[Series 5: coronal st · coronal · 0.72mm/px · 3 of 131 slices shown]
[im 44/131  soft-tissue]
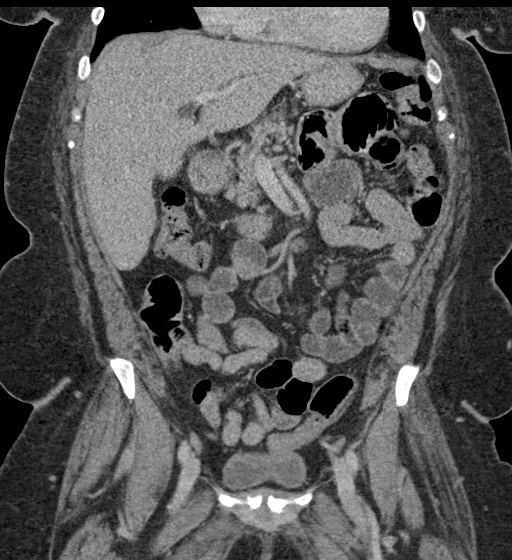
[im 58/131  soft-tissue]
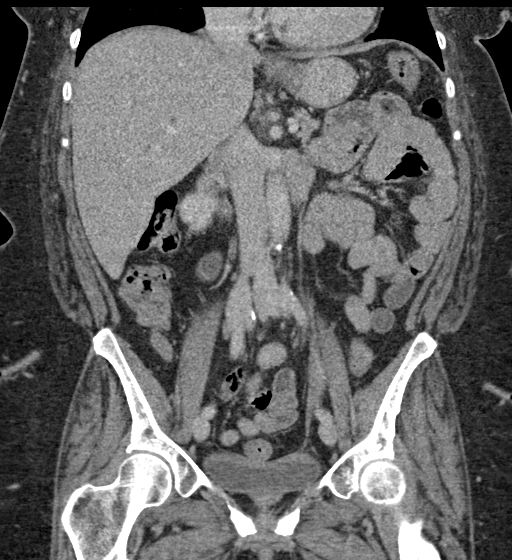
[im 73/131  soft-tissue]
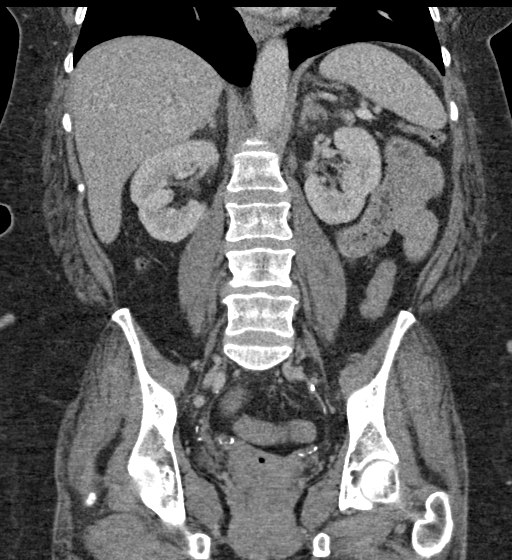

[17 of 46 positions shown; findings below may reference images not displayed]

FINDINGS: Lower chest: No acute abnormality.

Hepatobiliary: Anterior dome of liver cyst measures 1.8 cm. Previous
cholecystectomy. Mild intrahepatic bile duct dilatation. The common
bile duct measures 1.5 cm, image 51/5. There is abrupt tapering of
the distal CBD, image 52/5. No mass or choledocholithiasis noted.

Pancreas: Unremarkable. No pancreatic ductal dilatation or
surrounding inflammatory changes.

Spleen: Normal in size without focal abnormality.

Adrenals/Urinary Tract: Normal appearance of the adrenal glands. The
kidneys are unremarkable. No mass or hydronephrosis identified. The
urinary bladder appears normal.

Stomach/Bowel: Stomach is within normal limits. Appendix appears
normal. No evidence of bowel wall thickening, distention, or
inflammatory changes.

Vascular/Lymphatic: Aortic atherosclerosis. No abdominopelvic
adenopathy.

Reproductive: Status post hysterectomy. No adnexal masses.

Other: No free fluid or fluid collections identified.

Musculoskeletal: No acute or significant osseous findings.
IMPRESSION: 1. No acute findings within the abdomen or pelvis.
2. Increase caliber of the common bile duct which measures 15 mm,
status post cholecystectomy.
3.  Aortic Atherosclerosis (STPT3-LJ1.1).

## 2020-03-27 ENCOUNTER — Emergency Department (HOSPITAL_BASED_OUTPATIENT_CLINIC_OR_DEPARTMENT_OTHER): Payer: Medicare HMO

## 2020-03-27 ENCOUNTER — Emergency Department (HOSPITAL_BASED_OUTPATIENT_CLINIC_OR_DEPARTMENT_OTHER)
Admission: EM | Admit: 2020-03-27 | Discharge: 2020-03-27 | Disposition: A | Discharge status: Home / Self Care | Payer: Medicare HMO | Attending: Emergency Medicine | Admitting: Emergency Medicine

## 2020-03-27 ENCOUNTER — Encounter (HOSPITAL_BASED_OUTPATIENT_CLINIC_OR_DEPARTMENT_OTHER): Payer: Self-pay

## 2020-03-27 ENCOUNTER — Other Ambulatory Visit: Payer: Self-pay

## 2020-03-27 DIAGNOSIS — Z79899 Other long term (current) drug therapy: Secondary | ICD-10-CM | POA: Insufficient documentation

## 2020-03-27 DIAGNOSIS — I10 Essential (primary) hypertension: Secondary | ICD-10-CM | POA: Diagnosis not present

## 2020-03-27 DIAGNOSIS — W108XXA Fall (on) (from) other stairs and steps, initial encounter: Secondary | ICD-10-CM | POA: Diagnosis not present

## 2020-03-27 DIAGNOSIS — M549 Dorsalgia, unspecified: Secondary | ICD-10-CM | POA: Insufficient documentation

## 2020-03-27 DIAGNOSIS — Y92009 Unspecified place in unspecified non-institutional (private) residence as the place of occurrence of the external cause: Secondary | ICD-10-CM | POA: Insufficient documentation

## 2020-03-27 DIAGNOSIS — J45909 Unspecified asthma, uncomplicated: Secondary | ICD-10-CM | POA: Insufficient documentation

## 2020-03-27 DIAGNOSIS — S63501A Unspecified sprain of right wrist, initial encounter: Secondary | ICD-10-CM | POA: Diagnosis not present

## 2020-03-27 DIAGNOSIS — G8929 Other chronic pain: Secondary | ICD-10-CM | POA: Insufficient documentation

## 2020-03-27 DIAGNOSIS — Y998 Other external cause status: Secondary | ICD-10-CM | POA: Insufficient documentation

## 2020-03-27 DIAGNOSIS — Y9301 Activity, walking, marching and hiking: Secondary | ICD-10-CM | POA: Diagnosis not present

## 2020-03-27 DIAGNOSIS — Z91010 Allergy to peanuts: Secondary | ICD-10-CM | POA: Insufficient documentation

## 2020-03-27 DIAGNOSIS — S6991XA Unspecified injury of right wrist, hand and finger(s), initial encounter: Secondary | ICD-10-CM | POA: Diagnosis present

## 2020-03-27 NOTE — ED Provider Notes (Signed)
[facility] EMERGENCY DEPARTMENT Provider Note  CSN: [id number] Arrival date & time: 03/27/20 1856    History No chief complaint on file.   HPI  Gina Cummings Gina Cummings is a 54 y.o. female with chronic back pain reports she was walking up some stairs at home today following her toddler grandson when she slipped and fell onto her R outstretched hand. She reports moderate to severe aching pain in R hand, worse with movement.  She also bumped her R hip on the rail, but no significant pain there.    Past Medical History:  Diagnosis Date  . Asthma   . Chronic back pain   . Chronic, continuous use of opioids   . Drug-seeking behavior   . Hypertension   . Stroke [facility])    1 time    Past Surgical History:  Procedure Laterality Date  . ABDOMINAL HYSTERECTOMY    . CHOLECYSTECTOMY    . KNEE SURGERY      Family History  Problem Relation Age of Onset  . Cancer Mother   . CAD Father   . CAD Maternal Grandmother     Social History   Tobacco Use  . Smoking status: Never Smoker  . Smokeless tobacco: Never Used  Vaping Use  . Vaping Use: Never used  Substance Use Topics  . Alcohol use: Never  . Drug use: Never     Home Medications Prior to Admission medications   Medication Sig Start Date End Date Taking? Authorizing Provider  ADVAIR DISKUS 100-50 MCG/DOSE AEPB Inhale 1 puff into the lungs in the morning and at bedtime. 07/27/19   [provider]  albuterol (VENTOLIN HFA) 108 (90 Base) MCG/ACT inhaler Inhale 1-2 puffs into the lungs 4 (four) times daily as needed for wheezing or shortness of breath. 06/30/19   [provider]  amLODipine (NORVASC) 5 MG tablet Take 1 tablet (5 mg total) by mouth daily. 01/27/20 02/26/20  Jeannie Fend, PA-C  cloNIDine (CATAPRES) 0.1 MG tablet Take 1 tablet (0.1 mg total) by mouth 2 (two) times daily. 02/14/19   Ward, Layla Maw, DO  famotidine (PEPCID) 20 MG tablet Take 1 tablet (20 mg total) by mouth 2 (two) times  daily. 10/14/19   Fawze, Mina A, PA-C  hydrochlorothiazide (HYDRODIURIL) 25 MG tablet Take 1 tablet (25 mg total) by mouth daily. 01/27/20 02/26/20  Jeannie Fend, PA-C  methylPREDNISolone (MEDROL DOSEPAK) 4 MG TBPK tablet Follow package insert 01/22/20   Curatolo, Adam, DO  oxyCODONE (OXY IR/ROXICODONE) 5 MG immediate release tablet Take 5 mg by mouth every 4 (four) hours as needed for pain. 01/23/20   [provider]  senna (SENOKOT) 8.6 MG TABS tablet Take 1 tablet (8.6 mg total) by mouth 2 (two) times daily. 01/09/20   Bedelia Person, MD  tiZANidine (ZANAFLEX) 4 MG tablet Take 4 mg by mouth 3 (three) times daily as needed for muscle spasms. 01/17/20   [provider]     Allergies    Peanut-containing drug products, Shellfish allergy, Penicillins, Aspirin, Gabapentin, Ibuprofen, Lisinopril, Morphine and related, Nsaids, Tramadol, Trazodone, Fentanyl, and Morphine   Review of Systems   Review of Systems A comprehensive review of systems was completed and negative except as noted in HPI.    Physical Exam BP (!) 208/118 (BP Location: Left Arm)   Pulse 83   Temp 98.4 F (36.9 C) (Oral)   Resp 16   Ht 4\' 11"  (1.499 m)   Wt 98.4 kg  SpO2 100%   BMI 43.83 kg/m   Physical Exam Vitals and nursing note reviewed.  HENT:     Head: Normocephalic.     Nose: Nose normal.  Eyes:     Extraocular Movements: Extraocular movements intact.  Pulmonary:     Effort: Pulmonary effort is normal.  Musculoskeletal:        General: Swelling and tenderness (R dorsal hand, some snuff box tenderness) present. Normal range of motion.     Cervical back: Neck supple.  Skin:    Findings: No rash (on exposed skin).  Neurological:     Mental Status: She is alert and oriented to person, place, and time.  Psychiatric:        Mood and Affect: Mood normal.      ED Results / Procedures / Treatments   Labs (all labs ordered are listed, but only abnormal results are displayed) Labs  Reviewed - No data to display  EKG None   Radiology DG Hand Complete Right  Result Date: 03/27/2020 CLINICAL DATA:  Status post fall. EXAM: RIGHT HAND - COMPLETE 3+ VIEW COMPARISON:  None. FINDINGS: There is no evidence of fracture or dislocation. There is no evidence of arthropathy or other focal bone abnormality. Soft tissues are unremarkable. IMPRESSION: Negative. Electronically Signed   By: Aram Candela M.D.   On: 03/27/2020 20:06    Procedures Procedures  Medications Ordered in the ED Medications - No data to display   MDM Rules/Calculators/A&P MDM Xray neg, not dedicated wrist, but good views of scaphoid. Placed in thumb spica brace, referral to Ortho for re-xray in 1 weeks.  ED Course  I have reviewed the triage vital signs and the nursing notes.  Pertinent labs & imaging results that were available during my care of the patient were reviewed by me and considered in my medical decision making (see chart for details).     Final Clinical Impression(s) / ED Diagnoses Final diagnoses:  Sprain of right wrist, initial encounter    Rx / DC Orders ED Discharge Orders    None       Pollyann Savoy, MD 03/27/20 2238

## 2020-03-27 NOTE — Progress Notes (Signed)
No answer when called for xray

## 2020-03-27 NOTE — ED Triage Notes (Signed)
Pt fell up concrete steps ~4pm today. States pain/swelling to right hand

## 2021-05-24 ENCOUNTER — Other Ambulatory Visit: Payer: Self-pay

## 2021-05-24 ENCOUNTER — Emergency Department (HOSPITAL_COMMUNITY): Payer: Medicare Other

## 2021-05-24 ENCOUNTER — Encounter (HOSPITAL_COMMUNITY): Payer: Self-pay

## 2021-05-24 ENCOUNTER — Emergency Department (HOSPITAL_COMMUNITY)
Admission: EM | Admit: 2021-05-24 | Discharge: 2021-05-24 | Disposition: A | Discharge status: Home / Self Care | Payer: Medicare Other | Attending: Emergency Medicine | Admitting: Emergency Medicine

## 2021-05-24 DIAGNOSIS — F1123 Opioid dependence with withdrawal: Secondary | ICD-10-CM | POA: Diagnosis not present

## 2021-05-24 DIAGNOSIS — F430 Acute stress reaction: Secondary | ICD-10-CM | POA: Diagnosis not present

## 2021-05-24 DIAGNOSIS — I10 Essential (primary) hypertension: Secondary | ICD-10-CM

## 2021-05-24 DIAGNOSIS — J45909 Unspecified asthma, uncomplicated: Secondary | ICD-10-CM | POA: Insufficient documentation

## 2021-05-24 DIAGNOSIS — I110 Hypertensive heart disease with heart failure: Secondary | ICD-10-CM | POA: Insufficient documentation

## 2021-05-24 DIAGNOSIS — Z91010 Allergy to peanuts: Secondary | ICD-10-CM | POA: Diagnosis not present

## 2021-05-24 DIAGNOSIS — R112 Nausea with vomiting, unspecified: Secondary | ICD-10-CM | POA: Diagnosis not present

## 2021-05-24 DIAGNOSIS — I509 Heart failure, unspecified: Secondary | ICD-10-CM | POA: Insufficient documentation

## 2021-05-24 DIAGNOSIS — R079 Chest pain, unspecified: Secondary | ICD-10-CM | POA: Diagnosis present

## 2021-05-24 DIAGNOSIS — R197 Diarrhea, unspecified: Secondary | ICD-10-CM | POA: Insufficient documentation

## 2021-05-24 DIAGNOSIS — F439 Reaction to severe stress, unspecified: Secondary | ICD-10-CM

## 2021-05-24 DIAGNOSIS — F1193 Opioid use, unspecified with withdrawal: Secondary | ICD-10-CM

## 2021-05-24 LAB — COMPREHENSIVE METABOLIC PANEL
ALT: 45 U/L — ABNORMAL HIGH (ref 0–44)
AST: 40 U/L (ref 15–41)
Albumin: 3.8 g/dL (ref 3.5–5.0)
Alkaline Phosphatase: 110 U/L (ref 38–126)
Anion gap: 11 (ref 5–15)
BUN: 7 mg/dL (ref 6–20)
CO2: 24 mmol/L (ref 22–32)
Calcium: 8.9 mg/dL (ref 8.9–10.3)
Chloride: 104 mmol/L (ref 98–111)
Creatinine, Ser: 0.73 mg/dL (ref 0.44–1.00)
GFR, Estimated: >60 mL/min (ref >60)
Glucose, Bld: 119 mg/dL — ABNORMAL HIGH (ref 70–99)
Potassium: 3.1 mmol/L — ABNORMAL LOW (ref 3.5–5.1)
Sodium: 139 mmol/L (ref 135–145)
Total Bilirubin: 0.6 mg/dL (ref 0.3–1.2)
Total Protein: 7.5 g/dL (ref 6.5–8.1)

## 2021-05-24 LAB — CBC WITH DIFFERENTIAL/PLATELET
Abs Immature Granulocytes: 0.01 10*3/uL (ref 0.00–0.07)
Basophils Absolute: 0 10*3/uL (ref 0.0–0.1)
Basophils Relative: 0 %
Eosinophils Absolute: 0 10*3/uL (ref 0.0–0.5)
Eosinophils Relative: 0 %
HCT: 39 % (ref 36.0–46.0)
Hemoglobin: 12.8 g/dL (ref 12.0–15.0)
Immature Granulocytes: 0 %
Lymphocytes Relative: 32 %
Lymphs Abs: 1.2 10*3/uL (ref 0.7–4.0)
MCH: 29.8 pg (ref 26.0–34.0)
MCHC: 32.8 g/dL (ref 30.0–36.0)
MCV: 90.7 fL (ref 80.0–100.0)
Monocytes Absolute: 0.3 10*3/uL (ref 0.1–1.0)
Monocytes Relative: 8 %
Neutro Abs: 2.2 10*3/uL (ref 1.7–7.7)
Neutrophils Relative %: 60 %
Platelets: 300 10*3/uL (ref 150–400)
RBC: 4.3 MIL/uL (ref 3.87–5.11)
RDW: 13.7 % (ref 11.5–15.5)
WBC: 3.7 10*3/uL — ABNORMAL LOW (ref 4.0–10.5)
nRBC: 0 % (ref 0.0–0.2)

## 2021-05-24 LAB — TROPONIN I (HIGH SENSITIVITY)
Troponin I (High Sensitivity): 5 ng/L (ref <18)
Troponin I (High Sensitivity): 5 ng/L (ref <18)

## 2021-05-24 LAB — BRAIN NATRIURETIC PEPTIDE: B Natriuretic Peptide: 116.3 pg/mL — ABNORMAL HIGH (ref 0.0–100.0)

## 2021-05-24 MED ORDER — OXYCODONE HCL 5 MG PO TABS
10.0000 mg | ORAL_TABLET | Freq: Once | ORAL | Status: AC
  Administered 2021-05-24: 10 mg via ORAL
  Filled 2021-05-24: qty 2

## 2021-05-24 MED ORDER — HYDRALAZINE HCL 20 MG/ML IJ SOLN
10.0000 mg | Freq: Once | INTRAMUSCULAR | Status: AC
  Administered 2021-05-24: 10 mg via INTRAVENOUS
  Filled 2021-05-24: qty 1

## 2021-05-24 MED ORDER — IOHEXOL 350 MG/ML SOLN
100.0000 mL | Freq: Once | INTRAVENOUS | Status: AC | PRN
  Administered 2021-05-24: 100 mL via INTRAVENOUS

## 2021-05-24 MED ORDER — HALOPERIDOL LACTATE 5 MG/ML IJ SOLN
4.0000 mg | Freq: Once | INTRAMUSCULAR | Status: AC
  Administered 2021-05-24: 4 mg via INTRAVENOUS
  Filled 2021-05-24: qty 1

## 2021-05-24 MED ORDER — SODIUM CHLORIDE 0.9 % IV SOLN
25.0000 mg | Freq: Four times a day (QID) | INTRAVENOUS | Status: DC | PRN
  Administered 2021-05-24: 25 mg via INTRAVENOUS
  Filled 2021-05-24: qty 25

## 2021-05-24 MED ORDER — POTASSIUM CHLORIDE 10 MEQ/100ML IV SOLN
10.0000 meq | INTRAVENOUS | Status: DC
  Administered 2021-05-24 (×3): 10 meq via INTRAVENOUS
  Filled 2021-05-24 (×3): qty 100

## 2021-05-24 MED ORDER — HYDROMORPHONE HCL 1 MG/ML IJ SOLN
1.0000 mg | Freq: Once | INTRAMUSCULAR | Status: AC
  Administered 2021-05-24: 1 mg via INTRAVENOUS
  Filled 2021-05-24: qty 1

## 2021-05-24 MED ORDER — ONDANSETRON HCL 4 MG/2ML IJ SOLN
4.0000 mg | Freq: Once | INTRAMUSCULAR | Status: AC
  Administered 2021-05-24: 4 mg via INTRAVENOUS
  Filled 2021-05-24: qty 2

## 2021-05-24 MED ORDER — NITROGLYCERIN 0.4 MG SL SUBL
0.4000 mg | SUBLINGUAL_TABLET | SUBLINGUAL | Status: DC | PRN
  Administered 2021-05-24: 0.4 mg via SUBLINGUAL
  Filled 2021-05-24: qty 1

## 2021-05-24 MED ORDER — OXYCODONE HCL 10 MG PO TABS: 10.0000 mg | ORAL_TABLET | ORAL | 0 refills | Status: AC | PRN

## 2021-05-24 MED ORDER — DICYCLOMINE HCL 10 MG/ML IM SOLN
20.0000 mg | Freq: Once | INTRAMUSCULAR | Status: AC
  Administered 2021-05-24: 20 mg via INTRAMUSCULAR
  Filled 2021-05-24: qty 2

## 2021-05-24 MED ORDER — LOSARTAN POTASSIUM 25 MG PO TABS
25.0000 mg | ORAL_TABLET | Freq: Every day | ORAL | Status: DC
  Administered 2021-05-24: 25 mg via ORAL
  Filled 2021-05-24: qty 1

## 2021-05-24 MED ORDER — POTASSIUM CHLORIDE CRYS ER 20 MEQ PO TBCR
40.0000 meq | EXTENDED_RELEASE_TABLET | Freq: Once | ORAL | Status: AC
  Administered 2021-05-24: 40 meq via ORAL
  Filled 2021-05-24: qty 2

## 2021-05-24 MED ORDER — ONDANSETRON HCL 4 MG PO TABS: 4.0000 mg | ORAL_TABLET | Freq: Four times a day (QID) | ORAL | 0 refills | Status: AC

## 2021-05-24 NOTE — ED Notes (Signed)
Patient went to the bathroom prior to being triaged while in the lobby.

## 2021-05-24 NOTE — ED Triage Notes (Addendum)
Patient c/o left chest pain and SOB x 2 days. Patient reports a history of CHF.  Patient also c/o diarrhea and abdominal pain x 2 days.  Patient states she has not had her medications x 1 week because she is from out of town and forgot to bring her medications with her.

## 2021-05-24 NOTE — ED Notes (Signed)
As the Nurse Tech I was unable to get the patient vitals because she refuse and she had token all her cords off. I have notify the Nurse.

## 2021-05-24 NOTE — ED Provider Notes (Signed)
[facility] DEPT Provider Note   CSN: [id number] Arrival date & time: 05/24/21  0849     History Chief Complaint  Patient presents with   Chest Pain    Bronwynn Cummings Gina Cummings is a 55 y.o. female.  HPI     55yo female with history of asthma, hypertension, CVA, chronic back pain, who presents with concern for chest pain.   Reports she is here from [place] as her son-in-law died from colon cancer and she is helping her daughter. She has been under a lot of stress. Her medications were left in [facility].  Feels her symptoms are secondary to stress and not having her medications. She states she typically will get dilaudid to help her with her symptoms. These medications are not coming until Wednesday. Ran to the airport and forgot her medications. Had back surgery last year at [facility]. December 21st is supposed to have knee surgery.  Started having chest pains.  Was in the hospital last month for CHF.  [facility] in [place]. Chest pain began again a few days ago, thought it was from the CHF again. Has not been sleeping since she came here.  Nausea, shortness of breath, especially at night.  When lay down flat. Laying in [place] old hospital bed to sit up while sleeping.  CP feels sharp, stabbing pain, under left breast, sometimes feels it in the back.   Seems worse laying down.  Seems like it got better with nitroglycerin in ED. Diarrhea, has been since yesterday, 3 times a day, 4 today.  No black or bloody stools. Chills. No fever. No cough, runny nose.  Dyspnea just with laying down and exertion.  Vomiting at least 4-5 times.  Abdominal contraction like pain on ROS.    Has been off of atorvastatin, losartan, oxycodone, take 3 tablets 10mg  a day   Past Medical History:  Diagnosis Date   Asthma    Chronic back pain    Chronic, continuous use of opioids    Drug-seeking behavior    Hypertension    Stroke [facility])    1 time    Patient Active Problem List    Diagnosis Date Noted   Spondylolisthesis at L4-L5 level 01/04/2020   OA (osteoarthritis) of knee 10/12/2019    Past Surgical History:  Procedure Laterality Date   ABDOMINAL HYSTERECTOMY     CHOLECYSTECTOMY     KNEE SURGERY       OB History   No obstetric history on file.     Family History  Problem Relation Age of Onset   Cancer Mother    CAD Father    CAD Maternal Grandmother     Social History   Tobacco Use   Smoking status: Never   Smokeless tobacco: Never  Vaping Use   Vaping Use: Never used  Substance Use Topics   Alcohol use: Never   Drug use: Never    Home Medications Prior to Admission medications   Medication Sig Start Date End Date Taking? Authorizing Provider  ondansetron (ZOFRAN) 4 MG tablet Take 1 tablet (4 mg total) by mouth every 6 (six) hours. 05/24/21  Yes Curatolo, Adam, DO  ADVAIR DISKUS 100-50 MCG/DOSE AEPB Inhale 1 puff into the lungs in the morning and at bedtime. 07/27/19   [provider]  albuterol (VENTOLIN HFA) 108 (90 Base) MCG/ACT inhaler Inhale 1-2 puffs into the lungs 4 (four) times daily as needed for wheezing or shortness of breath. 06/30/19   [provider]  amLODipine (NORVASC) 5 MG tablet Take 1 tablet (5 mg total) by mouth daily. 01/27/20 02/26/20  Tacy Learn, PA-C  cloNIDine (CATAPRES) 0.1 MG tablet Take 1 tablet (0.1 mg total) by mouth 2 (two) times daily. 02/14/19   Ward, Delice Bison, DO  famotidine (PEPCID) 20 MG tablet Take 1 tablet (20 mg total) by mouth 2 (two) times daily. 10/14/19   Fawze, Mina A, PA-C  hydrochlorothiazide (HYDRODIURIL) 25 MG tablet Take 1 tablet (25 mg total) by mouth daily. 01/27/20 02/26/20  Tacy Learn, PA-C  methylPREDNISolone (MEDROL DOSEPAK) 4 MG TBPK tablet Follow package insert 01/22/20   Curatolo, Adam, DO  oxyCODONE 10 MG TABS Take 1 tablet (10 mg total) by mouth every 4 (four) hours as needed for up to 20 doses. 05/24/21   Curatolo, Adam, DO  senna (SENOKOT) 8.6 MG TABS tablet  Take 1 tablet (8.6 mg total) by mouth 2 (two) times daily. 01/09/20   Vallarie Mare, MD  tiZANidine (ZANAFLEX) 4 MG tablet Take 4 mg by mouth 3 (three) times daily as needed for muscle spasms. 01/17/20   [provider]    Allergies    Peanut-containing drug products, Shellfish allergy, Penicillins, Aspirin, Gabapentin, Ibuprofen, Lisinopril, Morphine and related, Nsaids, Tramadol, Trazodone, Fentanyl, and Morphine  Review of Systems   Review of Systems  Constitutional:  Negative for fever.  HENT:  Negative for sore throat.   Eyes:  Negative for visual disturbance.  Respiratory:  Negative for cough and shortness of breath.   Cardiovascular:  Positive for chest pain.  Gastrointestinal:  Positive for abdominal pain, diarrhea, nausea and vomiting.  Genitourinary:  Negative for difficulty urinating and dysuria.  Musculoskeletal:  Positive for back pain. Negative for neck pain.  Skin:  Negative for rash.  Neurological:  Negative for syncope and headaches.   Physical Exam Updated Vital Signs BP (!) 171/93   Pulse 85   Temp 98.1 F (36.7 C) (Oral)   Resp 20   Ht 4\' 11"  (1.499 m)   Wt 102.1 kg   SpO2 99%   BMI 45.44 kg/m   Physical Exam Vitals and nursing note reviewed.  Constitutional:      General: She is not in acute distress.    Appearance: She is well-developed. She is not diaphoretic.  HENT:     Head: Normocephalic and atraumatic.  Eyes:     Conjunctiva/sclera: Conjunctivae normal.  Cardiovascular:     Rate and Rhythm: Normal rate and regular rhythm.     Heart sounds: Normal heart sounds. No murmur heard.   No friction rub. No gallop.  Pulmonary:     Effort: Pulmonary effort is normal. No respiratory distress.     Breath sounds: Normal breath sounds. No wheezing or rales.  Abdominal:     General: There is no distension.     Palpations: Abdomen is soft.     Tenderness: There is abdominal tenderness. There is no guarding.  Musculoskeletal:        General:  No tenderness.     Cervical back: Normal range of motion.  Skin:    General: Skin is warm and dry.     Findings: No erythema or rash.  Neurological:     Mental Status: She is alert and oriented to person, place, and time.    ED Results / Procedures / Treatments   Labs (all labs ordered are listed, but only abnormal results are displayed) Labs Reviewed  CBC WITH DIFFERENTIAL/PLATELET - Abnormal; Notable for the following  components:      Result Value   WBC 3.7 (*)    All other components within normal limits  COMPREHENSIVE METABOLIC PANEL - Abnormal; Notable for the following components:   Potassium 3.1 (*)    Glucose, Bld 119 (*)    ALT 45 (*)    All other components within normal limits  BRAIN NATRIURETIC PEPTIDE - Abnormal; Notable for the following components:   B Natriuretic Peptide 116.3 (*)    All other components within normal limits  TROPONIN I (HIGH SENSITIVITY)  TROPONIN I (HIGH SENSITIVITY)    EKG EKG Interpretation  Date/Time:  Friday May 24 2021 09:09:15 EST Ventricular Rate:  88 PR Interval:  128 QRS Duration: 105 QT Interval:  398 QTC Calculation: 482 R Axis:   44 Text Interpretation: Sinus rhythm Probable left atrial enlargement No significant change since last tracing Confirmed by Gareth Morgan [phone]) on 05/24/2021 9:25:32 AM  Radiology DG Chest 2 View  Result Date: 05/24/2021 CLINICAL DATA:  55 year old female with history of chest pain and shortness of breath. EXAM: CHEST - 2 VIEW COMPARISON:  Chest x-ray 07/17/2019. FINDINGS: Lung volumes are normal. No consolidative airspace disease. No pleural effusions. No pneumothorax. No pulmonary nodule or mass noted. No evidence of pulmonary edema. Heart size is mildly enlarged. Upper mediastinal contours are within normal limits. IMPRESSION: 1. No radiographic evidence of acute cardiopulmonary disease. 2. Mild cardiomegaly, similar to the prior study. Electronically Signed   By: Vinnie Langton M.D.    On: 05/24/2021 09:43   CT Angio Chest/Abd/Pel for Dissection W and/or Wo Contrast  Result Date: 05/24/2021 CLINICAL DATA:  Evaluate for aortic dissection. Patient complains of chest pain and shortness of breath for 2 days. EXAM: CT ANGIOGRAPHY CHEST, ABDOMEN AND PELVIS TECHNIQUE: Non-contrast CT of the chest was initially obtained. Multidetector CT imaging through the chest, abdomen and pelvis was performed using the standard protocol during bolus administration of intravenous contrast. Multiplanar reconstructed images and MIPs were obtained and reviewed to evaluate the vascular anatomy. CONTRAST:  122mL OMNIPAQUE IOHEXOL 350 MG/ML SOLN COMPARISON:  CT AP 01/22/2020 FINDINGS: CTA CHEST FINDINGS Cardiovascular: Preferential opacification of the thoracic aorta. No evidence of thoracic aortic aneurysm or dissection. The main pulmonary arteries are well opacified and there are no signs to suggest central obstructing embolus. Normal heart size. No pericardial effusion. Aortic atherosclerosis with coronary artery calcifications. Mediastinum/Nodes: No enlarged mediastinal, hilar, or axillary lymph nodes. Thyroid gland, trachea, and esophagus demonstrate no significant findings. Lungs/Pleura: No pleural effusion. Mild peripheral ground-glass attenuation is noted within the lateral left lung base compatible with a nonspecific pneumonitis. No airspace consolidation identified. No signs of atelectasis or pneumothorax. No suspicious lung nodules identified. Musculoskeletal: No chest wall abnormality. No acute or significant osseous findings. Review of the MIP images confirms the above findings. CTA ABDOMEN AND PELVIS FINDINGS VASCULAR Aorta: Aortic atherosclerosis. No aneurysm. No signs of dissection or vasculitis or significant stenosis. Celiac: Calcified plaque noted at the base of the celiac artery with 20% stenosis. SMA: Calcified plaque noted at the origin of the SMA without significant stenosis. Renals: Calcified  plaque at the origin of the left renal artery noted with 20% stenosis. Calcified plaque at the origin of the right renal artery is noted with less than 10% stenosis. IMA: Calcified plaque is noted at the origin of the SMA with at least 50% stenosis. Inflow: Patent without evidence of aneurysm, dissection, vasculitis or significant stenosis. Veins: No obvious venous abnormality within the limitations of this arterial phase  study. Review of the MIP images confirms the above findings. NON-VASCULAR Hepatobiliary: Simple cyst within anterior dome of right hepatic lobe measures 1.6 cm. Status post cholecystectomy. Increase caliber of the common bile duct is noted measuring up to 2 cm proximally. Pancreas: Unremarkable. No pancreatic ductal dilatation or surrounding inflammatory changes. Spleen: Normal in size without focal abnormality. Adrenals/Urinary Tract: Normal adrenal glands. No kidney mass or hydronephrosis identified. Bladder is unremarkable. Stomach/Bowel: Small hiatal hernia. Mild wall thickening versus incomplete distension is noted involving the descending colon, sigmoid colon and rectum. Lymphatic: No signs of abdominopelvic adenopathy. Reproductive: Status post hysterectomy. No adnexal masses. Other: No free fluid or fluid collections. Musculoskeletal: Status post posterior hardware fixation and interbody fusion at L4-5. Review of the MIP images confirms the above findings. IMPRESSION: 1. No evidence for aortic dissection. 2. Mild wall thickening versus incomplete distension is noted involving the descending colon, sigmoid colon and rectum. Correlate for any clinical signs or symptoms of colitis. 3. Increase caliber of the common bile duct status post cholecystectomy. 4. Aortic Atherosclerosis (ICD10-I70.0). Electronically Signed   By: Kerby Moors M.D.   On: 05/24/2021 13:49    Procedures Procedures   Medications Ordered in ED Medications  losartan (COZAAR) tablet 25 mg (25 mg Oral Given 05/24/21  1658)  HYDROmorphone (DILAUDID) injection 1 mg (1 mg Intravenous Given 05/24/21 1032)  ondansetron (ZOFRAN) injection 4 mg (4 mg Intravenous Given 05/24/21 1031)  HYDROmorphone (DILAUDID) injection 1 mg (1 mg Intravenous Given 05/24/21 1220)  iohexol (OMNIPAQUE) 350 MG/ML injection 100 mL (100 mLs Intravenous Contrast Given 05/24/21 1313)  haloperidol lactate (HALDOL) injection 4 mg (4 mg Intravenous Given 05/24/21 1557)  hydrALAZINE (APRESOLINE) injection 10 mg (10 mg Intravenous Given 05/24/21 1603)  dicyclomine (BENTYL) injection 20 mg (20 mg Intramuscular Given 05/24/21 1554)  potassium chloride SA (KLOR-CON) CR tablet 40 mEq (40 mEq Oral Given 05/24/21 1757)  oxyCODONE (Oxy IR/ROXICODONE) immediate release tablet 10 mg (10 mg Oral Given 05/24/21 1757)    ED Course  I have reviewed the triage vital signs and the nursing notes.  Pertinent labs & imaging results that were available during my care of the patient were reviewed by me and considered in my medical decision making (see chart for details).    MDM Rules/Calculators/A&P                            55yo female with history of asthma, hypertension, CVA, chronic back pain, who presents with concern for chest pain.  Differential diagnosis for chest pain includes pulmonary embolus, dissection, pneumothorax, pneumonia, ACS, myocarditis, pericarditis.  EKG was done and evaluate by me and showed no acute ST changes and no signs of pericarditis. Chest x-ray was done and evaluated by me and radiology and showed no sign of pneumonia, pulmonary edema or pneumothorax.  NO sign of significant anemia, electrolyte abnormality.  Initial troponin negative, doubt ACS. Given stabbing pain, CT dissection study ordered.  Possibly opiate withdrawal as contributor to pain, nausea, vomiting, diarrhea.  Blood pressures elevated improved with nitroglycerin and pain/nausea medications.   CT shows no evidence of dissection, shows possible colitis..  2nd  troponin negative, BNP lower than previous, do not see significang findings to suggest CHF exacerbation at this time. GIven additional medication for nausea, pain and also given hydralazine for elevated blood pressures. Suspect symptoms multifactorial, possible viral syndrome, colitis, hypertension, and also suspect symptoms secondary to withdrawal from chronic opiates.  Signed out with continued  symptom control pending.     Final Clinical Impression(s) / ED Diagnoses Final diagnoses:  Nausea and vomiting, unspecified vomiting type  Diarrhea, unspecified type  Opiate withdrawal (HCC)  Chest pain, unspecified type  Hypertension, unspecified type  Stress    Rx / DC Orders ED Discharge Orders          Ordered    oxyCODONE 10 MG TABS  Every 4 hours PRN        05/24/21 1826    ondansetron (ZOFRAN) 4 MG tablet  Every 6 hours        05/24/21 1826             Alvira Monday, MD 05/24/21 2238

## 2021-05-24 NOTE — ED Notes (Signed)
Blood work delayed due to pt being a difficult stick. Charge nurse will do [place] IV

## 2021-05-24 NOTE — ED Provider Notes (Signed)
Patient signed out to me awaiting reevaluation.  Here for nausea and vomiting.  Has been without opioid pain medicine for the last several days.  Is visiting in town and forgot her medications including her chronic narcotic prescription.  She takes 10 mg oxycodone every 4 hours.  She had CT scan that was unremarkable.  May be a colitis.  No fever, no major white count.  She felt better after multiple rounds of antiemetics and pain medicine.  We will send her in a short course of pain medicine to get her through her stay.  We will also prescribe Zofran.  Discharged in good condition.  This chart was dictated using voice recognition software.  Despite best efforts to proofread,  errors can occur which can change the documentation meaning.    Virgina Norfolk, DO 05/24/21 [place]

## 2022-10-22 ENCOUNTER — Encounter: Payer: Self-pay | Admitting: *Deleted
